# Patient Record
Sex: Male | Born: 1990 | Race: Black or African American | Hispanic: No | Marital: Single | Smoking: Former smoker
Health system: Southern US, Community
[De-identification: ages and names within clinical notes are randomized; demographics above are authoritative.]

## PROBLEM LIST (undated history)

## (undated) HISTORY — PX: WISDOM TOOTH EXTRACTION: SHX21

---

## 2010-12-10 ENCOUNTER — Emergency Department (HOSPITAL_COMMUNITY)
Admission: EM | Admit: 2010-12-10 | Discharge: 2010-12-10 | Disposition: A | Discharge status: Home / Self Care | Payer: Self-pay | Attending: Emergency Medicine | Admitting: Emergency Medicine

## 2010-12-10 DIAGNOSIS — F410 Panic disorder [episodic paroxysmal anxiety] without agoraphobia: Secondary | ICD-10-CM | POA: Insufficient documentation

## 2011-02-06 ENCOUNTER — Emergency Department (HOSPITAL_BASED_OUTPATIENT_CLINIC_OR_DEPARTMENT_OTHER)
Admission: EM | Admit: 2011-02-06 | Discharge: 2011-02-06 | Disposition: A | Discharge status: Home / Self Care | Payer: Worker's Compensation | Attending: Emergency Medicine | Admitting: Emergency Medicine

## 2011-02-06 DIAGNOSIS — S61209A Unspecified open wound of unspecified finger without damage to nail, initial encounter: Secondary | ICD-10-CM | POA: Insufficient documentation

## 2011-02-06 DIAGNOSIS — Y9289 Other specified places as the place of occurrence of the external cause: Secondary | ICD-10-CM | POA: Insufficient documentation

## 2011-02-06 DIAGNOSIS — X58XXXA Exposure to other specified factors, initial encounter: Secondary | ICD-10-CM | POA: Insufficient documentation

## 2011-02-16 ENCOUNTER — Emergency Department (HOSPITAL_BASED_OUTPATIENT_CLINIC_OR_DEPARTMENT_OTHER)
Admission: EM | Admit: 2011-02-16 | Discharge: 2011-02-16 | Disposition: A | Discharge status: Home / Self Care | Payer: Worker's Compensation | Attending: Emergency Medicine | Admitting: Emergency Medicine

## 2011-02-16 DIAGNOSIS — Z09 Encounter for follow-up examination after completed treatment for conditions other than malignant neoplasm: Secondary | ICD-10-CM | POA: Insufficient documentation

## 2011-04-26 ENCOUNTER — Encounter: Payer: Self-pay | Admitting: *Deleted

## 2011-04-26 ENCOUNTER — Emergency Department (HOSPITAL_BASED_OUTPATIENT_CLINIC_OR_DEPARTMENT_OTHER)
Admission: EM | Admit: 2011-04-26 | Discharge: 2011-04-26 | Disposition: A | Discharge status: Home / Self Care | Payer: PRIVATE HEALTH INSURANCE | Attending: Emergency Medicine | Admitting: Emergency Medicine

## 2011-04-26 DIAGNOSIS — S0500XA Injury of conjunctiva and corneal abrasion without foreign body, unspecified eye, initial encounter: Secondary | ICD-10-CM

## 2011-04-26 DIAGNOSIS — IMO0002 Reserved for concepts with insufficient information to code with codable children: Secondary | ICD-10-CM | POA: Insufficient documentation

## 2011-04-26 DIAGNOSIS — Y92009 Unspecified place in unspecified non-institutional (private) residence as the place of occurrence of the external cause: Secondary | ICD-10-CM | POA: Insufficient documentation

## 2011-04-26 DIAGNOSIS — S058X9A Other injuries of unspecified eye and orbit, initial encounter: Secondary | ICD-10-CM | POA: Insufficient documentation

## 2011-04-26 MED ORDER — TETRACAINE HCL 0.5 % OP SOLN
2.0000 [drp] | Freq: Once | OPHTHALMIC | Status: AC
  Administered 2011-04-26: 2 [drp] via OPHTHALMIC

## 2011-04-26 MED ORDER — TETRACAINE HCL 0.5 % OP SOLN
OPHTHALMIC | Status: AC
  Administered 2011-04-26: 2 [drp] via OPHTHALMIC
  Filled 2011-04-26: qty 2

## 2011-04-26 MED ORDER — LIDOCAINE-EPINEPHRINE-TETRACAINE (LET) SOLUTION: 3.0000 mL | Freq: Once | NASAL | Status: DC

## 2011-04-26 MED ORDER — POLYMYXIN B-TRIMETHOPRIM 10000-0.1 UNIT/ML-% OP SOLN: 1.0000 [drp] | OPHTHALMIC | Status: AC

## 2011-04-26 MED ORDER — FLUORESCEIN SODIUM 1 MG OP STRP
1.0000 | ORAL_STRIP | Freq: Once | OPHTHALMIC | Status: AC
  Administered 2011-04-26: 1 via OPHTHALMIC

## 2011-04-26 MED ORDER — FLUORESCEIN SODIUM 1 MG OP STRP
ORAL_STRIP | OPHTHALMIC | Status: AC
  Administered 2011-04-26: 1 via OPHTHALMIC
  Filled 2011-04-26: qty 1

## 2011-04-26 NOTE — ED Notes (Signed)
Patient states he was cleaning battery cables and blow the debris off and foreign body went into left eye.  Rubbed immediately afterwards, then flushed with water for 5 minutes.  C/O blurry vision in left eye.

## 2011-04-26 NOTE — ED Provider Notes (Signed)
History     CSN: [id number] Arrival date & time: 04/26/2011  8:40 AM  Chief Complaint  Patient presents with  . Eye Injury    foreign body in eye   HPI Comments: Patient was bowling and debris off of a battery and felt a foreign body on his left side. He flushed with 5 meds of water at home. Complains of pain in his left eye with blurry vision. His right eye is normal.  He denies any contacts or eyeglasses use. He did not get any liquid into his eye. He denies using any kind of acid or base.  The history is provided by the patient.    History reviewed. No pertinent past medical history.  Past Surgical History  Procedure Date  . Wisdom tooth extraction     No family history on file.  History  Substance Use Topics  . Smoking status: Passive Smoker    Types: Cigars  . Smokeless tobacco: Not on file  . Alcohol Use: Yes     rarely      Review of Systems  HENT: Negative for neck pain.   Eyes: Positive for photophobia, pain, discharge, redness and visual disturbance.  Respiratory: Negative for cough, chest tightness and shortness of breath.   Gastrointestinal: Negative for nausea and vomiting.  Genitourinary: Negative for dysuria and hematuria.  Musculoskeletal: Negative for back pain.  Skin: Negative for pallor.  Neurological: Negative for headaches.  Hematological: Negative.     Physical Exam  BP 121/80  Pulse 80  Temp(Src) 98.2 F (36.8 C) (Oral)  Resp 14  Ht 5\' 11"  (1.803 m)  Wt 110 lb (49.896 kg)  BMI 15.34 kg/m2  SpO2 100%  Physical Exam  Constitutional: He is oriented to person, place, and time. He appears well-developed and well-nourished. No distress.  HENT:  Head: Normocephalic and atraumatic.  Mouth/Throat: Oropharynx is clear and moist. No oropharyngeal exudate.  Eyes: EOM are normal. Pupils are equal, round, and reactive to light. No foreign bodies found. Left conjunctiva is injected.  Slit lamp exam:      The left eye shows corneal abrasion and  fluorescein uptake.    Neck: Normal range of motion.  Cardiovascular: Normal rate, regular rhythm and normal heart sounds.   Pulmonary/Chest: Effort normal and breath sounds normal. No respiratory distress.  Abdominal: Soft. There is no tenderness. There is no rebound and no guarding.  Musculoskeletal: Normal range of motion. He exhibits no edema and no tenderness.  Neurological: He is alert and oriented to person, place, and time. No cranial nerve deficit.  Skin: Skin is warm.    ED Course  Procedures  MDM Eye irritation with probable foreign body.  Tetanus UTD.    PH checked and 7-8.  Continue irrigation.  Eyes irrigated for additional 15 minutes and pH remains 7-8.  Anterior chamber clear on slit lamp.   Multiple abrasions seen without evidence foreign body on lid eversion.   Treat for corneal abrasion with ophtho followup later today.      Glynn Octave, MD 04/26/11 1023

## 2011-04-26 NOTE — ED Notes (Signed)
Irrigated left w/ NS 500 cc.  Tolerated well.

## 2011-06-20 ENCOUNTER — Encounter (HOSPITAL_BASED_OUTPATIENT_CLINIC_OR_DEPARTMENT_OTHER): Payer: Self-pay | Admitting: *Deleted

## 2011-06-20 ENCOUNTER — Emergency Department (HOSPITAL_BASED_OUTPATIENT_CLINIC_OR_DEPARTMENT_OTHER)
Admission: EM | Admit: 2011-06-20 | Discharge: 2011-06-20 | Disposition: A | Discharge status: Home / Self Care | Payer: PRIVATE HEALTH INSURANCE | Attending: Emergency Medicine | Admitting: Emergency Medicine

## 2011-06-20 DIAGNOSIS — M545 Low back pain, unspecified: Secondary | ICD-10-CM | POA: Insufficient documentation

## 2011-06-20 DIAGNOSIS — M549 Dorsalgia, unspecified: Secondary | ICD-10-CM

## 2011-06-20 MED ORDER — NAPROXEN 250 MG PO TABS
500.0000 mg | ORAL_TABLET | Freq: Once | ORAL | Status: AC
  Administered 2011-06-20: 500 mg via ORAL
  Filled 2011-06-20: qty 2

## 2011-06-20 MED ORDER — NAPROXEN 375 MG PO TABS: 375.0000 mg | ORAL_TABLET | Freq: Two times a day (BID) | ORAL | Status: DC

## 2011-06-20 MED ORDER — CYCLOBENZAPRINE HCL 10 MG PO TABS: 10.0000 mg | ORAL_TABLET | Freq: Two times a day (BID) | ORAL | Status: AC | PRN

## 2011-06-20 MED ORDER — CYCLOBENZAPRINE HCL 10 MG PO TABS
5.0000 mg | ORAL_TABLET | Freq: Once | ORAL | Status: DC
  Filled 2011-06-20: qty 1

## 2011-06-20 MED ORDER — HYDROCODONE-ACETAMINOPHEN 7.5-500 MG/15ML PO SOLN: 15.0000 mL | Freq: Every evening | ORAL | Status: AC | PRN

## 2011-06-20 NOTE — ED Provider Notes (Signed)
History     CSN: [id number] Arrival date & time: 06/20/2011  2:37 AM   First MD Initiated Contact with Patient 06/20/11 0255      Chief Complaint  Patient presents with  . Back Pain    (Consider location/radiation/quality/duration/timing/severity/associated sxs/prior treatment) Patient is a 20 y.o. male presenting with back pain. The history is provided by the patient.  Back Pain  This is a recurrent problem. The current episode started 12 to 24 hours ago. The problem occurs every several days. The problem has not changed since onset.The pain is associated with no known injury. The pain is present in the lumbar spine. The quality of the pain is described as aching. The pain does not radiate. The pain is moderate. The symptoms are aggravated by bending, twisting and certain positions. Pertinent negatives include no chest pain, no fever, no numbness, no weight loss, no headaches, no abdominal pain, no bowel incontinence, no bladder incontinence, no dysuria, no leg pain, no paresthesias, no paresis and no tingling. Treatments tried: Icy hot. The treatment provided mild relief. Risk factors: Negative for diabetes.   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 He works night and finish his shift post full by loss he is to be evaluated before returning to work. No falls or trauma otherwise. No weakness or numbness. Pain improved by rest.  History reviewed. No pertinent past medical history.  Past Surgical History  Procedure Date  . Wisdom tooth extraction     No family history on file.  History  Substance Use Topics  . Smoking status: Former Smoker    Types: Cigars  . Smokeless tobacco: Not on file  . Alcohol Use: Yes     rarely      Review of Systems  Constitutional: Negative for fever, chills and weight loss.  HENT: Negative for sore throat, neck pain and neck stiffness.   Eyes: Negative for pain.  Respiratory: Negative for  shortness of breath.   Cardiovascular: Negative for chest pain.  Gastrointestinal: Negative for abdominal pain and bowel incontinence.  Genitourinary: Negative for bladder incontinence, dysuria, frequency, hematuria and flank pain.  Musculoskeletal: Positive for back pain. Negative for joint swelling.  Skin: Negative for rash and wound.  Neurological: Negative for tingling, numbness, headaches and paresthesias.  All other systems reviewed and are negative.    Allergies  Review of patient's allergies indicates no known allergies.  Home Medications   Current Outpatient Rx  Name Route Sig Dispense Refill  . IBUPROFEN 100 MG PO CHEW Oral Chew 100 mg by mouth every 8 (eight) hours as needed.        BP 123/53  Pulse 73  Temp(Src) 98 F (36.7 C) (Oral)  Resp 18  SpO2 100%  Physical Exam  Constitutional: He is oriented to person, place, and time. He appears well-developed and well-nourished.  HENT:  Head: Normocephalic and atraumatic.  Eyes: Conjunctivae and EOM are normal. Pupils are equal, round, and reactive to light.  Neck: Full passive range of motion without pain. Neck supple. No thyromegaly present.       No midline tenderness throughout cervical thoracic and lumbar region. Localizes discomfort to right paralumbar without any reproducible tenderness. No skin discoloration or rash. No lesion. No lower extremity deficits with strength sensorium and DTR testing.  Cardiovascular: Normal rate, regular rhythm, S1 normal, S2 normal and intact distal pulses.   Pulmonary/Chest: Effort normal and breath sounds normal.  Abdominal: Soft. Bowel sounds are normal. There is no tenderness. There is no CVA tenderness.  Musculoskeletal: Normal range of motion.  Neurological: He is alert and oriented to person, place, and time. He has normal strength and normal reflexes. No cranial nerve deficit or sensory deficit. He displays a negative Romberg sign. GCS eye subscore is 4. GCS verbal subscore is  5. GCS motor subscore is 6.       Normal Gait  Skin: Skin is warm and dry. No rash noted. No cyanosis. Nails show no clubbing.  Psychiatric: He has a normal mood and affect. His speech is normal and behavior is normal.    ED Course  Procedures (including critical care time)  Labs Reviewed - No data to display    MDM  Lower back pain likely musculoskeletal in nature. No red flags on exam indicate a need for emergent MRI. Pain medications provided with back pain precautions. Reliable historian states understanding all discharge and followup instructions as well as strict return precautions for any worsening condition.        Sunnie Nielsen, MD 06/20/11 0330

## 2011-06-20 NOTE — ED Notes (Signed)
Patient feels like he has back strain, been going on for several weeks, worsening recently

## 2011-07-07 ENCOUNTER — Emergency Department (HOSPITAL_BASED_OUTPATIENT_CLINIC_OR_DEPARTMENT_OTHER)
Admission: EM | Admit: 2011-07-07 | Discharge: 2011-07-07 | Disposition: A | Discharge status: Home / Self Care | Payer: PRIVATE HEALTH INSURANCE | Attending: Emergency Medicine | Admitting: Emergency Medicine

## 2011-07-07 ENCOUNTER — Encounter (HOSPITAL_BASED_OUTPATIENT_CLINIC_OR_DEPARTMENT_OTHER): Payer: Self-pay | Admitting: *Deleted

## 2011-07-07 DIAGNOSIS — Z008 Encounter for other general examination: Secondary | ICD-10-CM | POA: Insufficient documentation

## 2011-07-07 DIAGNOSIS — Z0000 Encounter for general adult medical examination without abnormal findings: Secondary | ICD-10-CM

## 2011-07-07 MED ORDER — HYDROCODONE-ACETAMINOPHEN 5-325 MG PO TABS: 1.0000 | ORAL_TABLET | Freq: Three times a day (TID) | ORAL | Status: AC | PRN

## 2011-07-07 NOTE — ED Provider Notes (Signed)
History     CSN: [id number] Arrival date & time: 07/07/2011  3:29 PM   First MD Initiated Contact with Patient 07/07/11 1545      Chief Complaint  Patient presents with  . Letter for School/Work    (Consider location/radiation/quality/duration/timing/severity/associated sxs/prior treatment) HPI Patient is here for a work note stating go back to work. History reviewed. No pertinent past medical history.  Past Surgical History  Procedure Date  . Wisdom tooth extraction     History reviewed. No pertinent family history.  History  Substance Use Topics  . Smoking status: Former Smoker    Types: Cigars  . Smokeless tobacco: Not on file  . Alcohol Use: No     rarely      Review of Systems  All other systems reviewed and are negative.    Allergies  Review of patient's allergies indicates no known allergies.  Home Medications   Current Outpatient Rx  Name Route Sig Dispense Refill  . CYCLOBENZAPRINE HCL 10 MG PO TABS Oral Take 10 mg by mouth 3 (three) times daily as needed. For muscle spasms      . HYDROCODONE-ACETAMINOPHEN 7.5-500 MG/15ML PO SOLN Oral Take 15 mLs by mouth once as needed. For pain     . NAPROXEN 375 MG PO TABS Oral Take 375 mg by mouth 2 (two) times daily as needed. For pain       BP 107/63  Pulse 97  Temp(Src) 98.1 F (36.7 C) (Oral)  Resp 16  Ht 5\' 10"  (1.778 m)  Wt 115 lb (52.164 kg)  BMI 16.50 kg/m2  SpO2 100%  Physical Exam  Constitutional: He appears well-developed and well-nourished.  Cardiovascular: Normal rate and regular rhythm.   Pulmonary/Chest: Effort normal and breath sounds normal.  Musculoskeletal:       Lumbar back: Normal.    ED Course  Procedures (including critical care time)  Labs Reviewed - No data to display No results found.   No diagnosis found.    MDM  Patient is here only for a work note to return to work.  Patient is having no difficulty or pain at this time        Carlyle Dolly,  [place] 07/07/11 1633  Carlyle Dolly, [place] 07/07/11 [phone]

## 2011-07-07 NOTE — ED Provider Notes (Signed)
Medical screening examination/treatment/procedure(s) were performed by non-physician practitioner and as supervising physician I was immediately available for consultation/collaboration.  Ramello Cordial T Bob Eastwood, MD 07/07/11 2317 

## 2011-07-07 NOTE — ED Notes (Signed)
Pt seen here 10/29. Needs return to work/military note

## 2012-04-20 ENCOUNTER — Emergency Department (HOSPITAL_BASED_OUTPATIENT_CLINIC_OR_DEPARTMENT_OTHER)
Admission: EM | Admit: 2012-04-20 | Discharge: 2012-04-20 | Disposition: A | Discharge status: Home / Self Care | Payer: PRIVATE HEALTH INSURANCE | Attending: Emergency Medicine | Admitting: Emergency Medicine

## 2012-04-20 ENCOUNTER — Encounter (HOSPITAL_BASED_OUTPATIENT_CLINIC_OR_DEPARTMENT_OTHER): Payer: Self-pay | Admitting: Emergency Medicine

## 2012-04-20 DIAGNOSIS — M549 Dorsalgia, unspecified: Secondary | ICD-10-CM | POA: Insufficient documentation

## 2012-04-20 DIAGNOSIS — Z87891 Personal history of nicotine dependence: Secondary | ICD-10-CM | POA: Insufficient documentation

## 2012-04-20 MED ORDER — HYDROCODONE-ACETAMINOPHEN 7.5-325 MG/15ML PO SOLN: 10.0000 mL | Freq: Four times a day (QID) | ORAL | Status: AC | PRN

## 2012-04-20 MED ORDER — NAPROXEN SODIUM 220 MG PO TABS: ORAL_TABLET | ORAL | Status: DC

## 2012-04-20 MED ORDER — HYDROCODONE-ACETAMINOPHEN 5-325 MG PO TABS: 1.0000 | ORAL_TABLET | Freq: Four times a day (QID) | ORAL | Status: DC | PRN

## 2012-04-20 NOTE — ED Provider Notes (Signed)
History     CSN: [id number]  Arrival date & time 04/20/12  0032   First MD Initiated Contact with Patient 04/20/12 0126      Chief Complaint  Patient presents with  . Back Pain    (Consider location/radiation/quality/duration/timing/severity/associated sxs/prior treatment) HPI This is a 21 year old black male with about a one-year history of back pain. This pain had been mild until about a month ago when it worsened. Is located on the right side above the SI joint. The pain is sharp and worse with bending, palpation and certain positions. It is improved by sleeping with a pillow under his back. He is here this morning because it acutely worsened several hours ago. There is no associated numbness or weakness and there are no bowel or bladder changes. He had previously been getting relief from ibuprofen but not now.  History reviewed. No pertinent past medical history.  Past Surgical History  Procedure Date  . Wisdom tooth extraction     No family history on file.  History  Substance Use Topics  . Smoking status: Former Smoker    Types: Cigars  . Smokeless tobacco: Not on file  . Alcohol Use: Not on file     rarely      Review of Systems  All other systems reviewed and are negative.    Allergies  Review of patient's allergies indicates no known allergies.  Home Medications   Current Outpatient Rx  Name Route Sig Dispense Refill  . CYCLOBENZAPRINE HCL 10 MG PO TABS Oral Take 10 mg by mouth 3 (three) times daily as needed. For muscle spasms      . HYDROCODONE-ACETAMINOPHEN 7.5-500 MG/15ML PO SOLN Oral Take 15 mLs by mouth once as needed. For pain     . NAPROXEN 375 MG PO TABS Oral Take 375 mg by mouth 2 (two) times daily as needed. For pain       BP 120/75  Pulse 74  Temp 98.6 F (37 C) (Oral)  Resp 20  Ht 5\' 11"  (1.803 m)  Wt 112 lb 12.8 oz (51.166 kg)  BMI 15.73 kg/m2  SpO2 100%  Physical Exam General: Well-developed, well-nourished male in no acute  distress; appearance consistent with age of record HENT: normocephalic, atraumatic Eyes: Cosmetic contact lenses Neck: supple Heart: regular rate and rhythm Lungs: clear to auscultation bilaterally Abdomen: soft; nondistended; nontender Back: Tenderness above right SI joint without palpable muscle spasm; pain on flexion of back Extremities: No deformity; full range of motion Neurologic: Awake, alert and oriented; motor function intact in all extremities and symmetric; no facial droop Skin: Warm and dry Psychiatric: Normal mood and affect    ED Course  Procedures (including critical care time)     MDM  Patient has been prescribed Flexeril in the past but he states that it causes him to be drowsy and dysphoric.        Hanley Seamen, MD 04/20/12 [phone]

## 2012-04-20 NOTE — ED Notes (Signed)
Denies numbness or tingling down either leg.  Denies any loss of bladder or bowel.  Denies any injuries.

## 2012-04-20 NOTE — ED Notes (Signed)
DR Molpus at bedside 

## 2012-04-20 NOTE — ED Notes (Signed)
Back and rt side pain x 1 month

## 2015-05-10 ENCOUNTER — Emergency Department (HOSPITAL_BASED_OUTPATIENT_CLINIC_OR_DEPARTMENT_OTHER)
Admission: EM | Admit: 2015-05-10 | Discharge: 2015-05-10 | Disposition: A | Discharge status: Home / Self Care | Payer: Managed Care, Other (non HMO) | Attending: Emergency Medicine | Admitting: Emergency Medicine

## 2015-05-10 ENCOUNTER — Encounter (HOSPITAL_BASED_OUTPATIENT_CLINIC_OR_DEPARTMENT_OTHER): Payer: Self-pay | Admitting: *Deleted

## 2015-05-10 DIAGNOSIS — Z87891 Personal history of nicotine dependence: Secondary | ICD-10-CM | POA: Diagnosis not present

## 2015-05-10 DIAGNOSIS — N342 Other urethritis: Secondary | ICD-10-CM | POA: Diagnosis not present

## 2015-05-10 DIAGNOSIS — Z791 Long term (current) use of non-steroidal anti-inflammatories (NSAID): Secondary | ICD-10-CM | POA: Insufficient documentation

## 2015-05-10 DIAGNOSIS — R300 Dysuria: Secondary | ICD-10-CM | POA: Diagnosis present

## 2015-05-10 LAB — WET PREP, GENITAL
Clue Cells Wet Prep HPF POC: NONE SEEN
TRICH WET PREP: NONE SEEN
Yeast Wet Prep HPF POC: NONE SEEN

## 2015-05-10 MED ORDER — CEFTRIAXONE SODIUM 250 MG IJ SOLR
250.0000 mg | INTRAMUSCULAR | Status: DC
  Administered 2015-05-10: 250 mg via INTRAMUSCULAR
  Filled 2015-05-10: qty 250

## 2015-05-10 MED ORDER — LIDOCAINE HCL (PF) 1 % IJ SOLN
INTRAMUSCULAR | Status: AC
Start: 2015-05-10 — End: 2015-05-10
  Administered 2015-05-10: 08:00:00
  Filled 2015-05-10: qty 5

## 2015-05-10 MED ORDER — AZITHROMYCIN 250 MG PO TABS
1000.0000 mg | ORAL_TABLET | Freq: Once | ORAL | Status: AC
  Administered 2015-05-10: 1000 mg via ORAL
  Filled 2015-05-10: qty 4

## 2015-05-10 NOTE — ED Provider Notes (Addendum)
CSN: [id number]     Arrival date & time 05/10/15  4098 History   First MD Initiated Contact with Patient 05/10/15 [phone]     Chief Complaint  Patient presents with  . Dysuria      HPI  Patient presents for evaluation of urethral discharge and dysuria. He states he started noticing a yellow no acute drainage from his penis 2 days ago. He was told by X partner a month ago that she had "cheated on me and had gonorrhea". He notices some increased frequency of urination. No source blisters lesions. No testicular pain or groin swelling.  History reviewed. No pertinent past medical history. Past Surgical History  Procedure Laterality Date  . Wisdom tooth extraction     History reviewed. No pertinent family history. Social History  Substance Use Topics  . Smoking status: Former Smoker    Types: Cigars  . Smokeless tobacco: None  . Alcohol Use: None     Comment: rarely    Review of Systems  Constitutional: Negative for fever, chills, diaphoresis, appetite change and fatigue.  HENT: Negative for mouth sores, sore throat and trouble swallowing.   Eyes: Negative for visual disturbance.  Respiratory: Negative for cough, chest tightness, shortness of breath and wheezing.   Cardiovascular: Negative for chest pain.  Gastrointestinal: Negative for nausea, vomiting, abdominal pain, diarrhea and abdominal distention.  Endocrine: Negative for polydipsia, polyphagia and polyuria.  Genitourinary: Positive for dysuria and discharge. Negative for frequency and hematuria.  Musculoskeletal: Negative for gait problem.  Skin: Negative for color change, pallor and rash.  Neurological: Negative for dizziness, syncope, light-headedness and headaches.  Hematological: Does not bruise/bleed easily.  Psychiatric/Behavioral: Negative for behavioral problems and confusion.      Allergies  Review of patient's allergies indicates no known allergies.  Home Medications   Prior to Admission medications    Medication Sig Start Date End Date Taking? Authorizing Provider  cyclobenzaprine (FLEXERIL) 10 MG tablet Take 10 mg by mouth 3 (three) times daily as needed. For muscle spasms      Historical Provider, MD  HYDROcodone-acetaminophen (LORTAB) 7.5-500 MG/15ML solution Take 15 mLs by mouth once as needed. For pain     Historical Provider, MD  naproxen sodium (ALEVE) 220 MG tablet Take 2 tablets twice daily for back pain. Best taken with a meal. 04/20/12   John Molpus, MD   BP 103/85 mmHg  Pulse 80  Temp(Src) 97.9 F (36.6 C) (Oral)  Resp 16  Ht  (1.803 m)  Wt 120 lb (54.432 kg)  BMI 16.74 kg/m2  SpO2 100% Physical Exam  Constitutional: He is oriented to person, place, and time. He appears well-developed and well-nourished. No distress.  HENT:  Head: Normocephalic.  Eyes: Conjunctivae are normal. Pupils are equal, round, and reactive to light. No scleral icterus.  Neck: Normal range of motion. Neck supple. No thyromegaly present.  Cardiovascular: Normal rate and regular rhythm.  Exam reveals no gallop and no friction rub.   No murmur heard. Pulmonary/Chest: Effort normal and breath sounds normal. No respiratory distress. He has no wheezes. He has no rales.  Abdominal: Soft. Bowel sounds are normal. He exhibits no distension. There is no tenderness. There is no rebound.  Genitourinary:     Musculoskeletal: Normal range of motion.  Neurological: He is alert and oriented to person, place, and time.  Skin: Skin is warm and dry. No rash noted.  Psychiatric: He has a normal mood and affect. His behavior is normal.    ED  Course  Procedures (including critical care time) Labs Review Labs Reviewed  WET PREP, GENITAL - Abnormal; Notable for the following:    WBC, Wet Prep HPF POC TOO NUMEROUS TO COUNT (*)    All other components within normal limits  GC/CHLAMYDIA PROBE AMP ([place]) NOT AT [facility]    Imaging Review No results found. I have personally reviewed and evaluated  these images and lab results as part of my medical decision-making.   EKG Interpretation None      MDM   Final diagnoses:  Urethritis    Wet prep GC chlamydia collected. Given Rocephin IM, by mouth Zithromax.    Rolland Porter, MD 05/10/15 1610  Rolland Porter, MD 05/10/15 [phone]

## 2015-05-10 NOTE — ED Notes (Signed)
No reaction noted after IM injection

## 2015-05-10 NOTE — ED Notes (Signed)
Cultures to lab

## 2015-05-10 NOTE — ED Notes (Signed)
Culture swab obtained by EDP

## 2015-05-10 NOTE — Discharge Instructions (Signed)
You have been given empiric treatment for both gonorrhea, and chlamydia. No additional treatment is necessary. Your testing results would be available through medical records, or " my chart" in 48-72 hours.   Urethritis Urethritis is an inflammation of the tube through which urine exits your bladder (urethra).  CAUSES Urethritis is often caused by an infection in your urethra. The infection can be viral, like herpes. The infection can also be bacterial, like gonorrhea. RISK FACTORS Risk factors of urethritis include:  Having sex without using a condom.  Having multiple sexual partners.  Having poor hygiene. SIGNS AND SYMPTOMS Symptoms of urethritis are less noticeable in women than in men. These symptoms include:  Burning feeling when you urinate (dysuria).  Discharge from your urethra.  Blood in your urine (hematuria).  Urinating more than usual. DIAGNOSIS  To confirm a diagnosis of urethritis, your health care provider will do the following:  Ask about your sexual history.  Perform a physical exam.  Have you provide a sample of your urine for lab testing.  Use a cotton swab to gently collect a sample from your urethra for lab testing. TREATMENT  It is important to treat urethritis. Depending on the cause, untreated urethritis may lead to serious genital infections and possibly infertility. Urethritis caused by a bacterial infection is treated with antibiotic medicine. All sexual partners must be treated.  HOME CARE INSTRUCTIONS  Do not have sex until the test results are known and treatment is completed, even if your symptoms go away before you finish treatment.  If you were prescribed an antibiotic, finish it all even if you start to feel better. SEEK MEDICAL CARE IF:   Your symptoms are not improved in 3 days.  Your symptoms are getting worse.  You develop abdominal pain or pelvic pain (in women).  You develop joint pain.  You have a fever. SEEK IMMEDIATE  MEDICAL CARE IF:   You have severe pain in the belly, back, or side.  You have repeated vomiting. MAKE SURE YOU:  Understand these instructions.  Will watch your condition.  Will get help right away if you are not doing well or get worse. Document Released: 02/01/2001 Document Revised: 12/23/2013 Document Reviewed: 04/08/2013 [facility] Patient Information 2015 [place], [place]. This information is not intended to replace advice given to you by your health care provider. Make sure you discuss any questions you have with your health care provider.

## 2015-05-10 NOTE — ED Notes (Signed)
MD at bedside. 

## 2015-05-10 NOTE — ED Notes (Signed)
States has "milky white" drainage from penis, states that male partner has been exposed and has an STD, mild burning

## 2015-05-10 NOTE — ED Notes (Signed)
Pt states has discharge from penis intermittently. Mild burning at time upon urination

## 2015-05-11 LAB — GC/CHLAMYDIA PROBE AMP (~~LOC~~) NOT AT ARMC
Chlamydia: NEGATIVE
NEISSERIA GONORRHEA: POSITIVE — AB

## 2015-05-12 ENCOUNTER — Telehealth (HOSPITAL_COMMUNITY): Payer: Self-pay

## 2015-05-12 NOTE — Telephone Encounter (Signed)
Positive for gonorrhea. Treated per protocol. [employer] form faxed. Attempting to contact.  

## 2015-05-13 ENCOUNTER — Telehealth (HOSPITAL_COMMUNITY): Payer: Self-pay | Admitting: Emergency Medicine

## 2015-05-13 NOTE — Telephone Encounter (Signed)
ID verified x three, patient notified of positive Gonorrhea and that treatment was given while in ED. STD instructions given, patient verbalized understanding.

## 2016-12-25 ENCOUNTER — Emergency Department (HOSPITAL_BASED_OUTPATIENT_CLINIC_OR_DEPARTMENT_OTHER)
Admission: EM | Admit: 2016-12-25 | Discharge: 2016-12-25 | Disposition: A | Discharge status: Home / Self Care | Payer: PRIVATE HEALTH INSURANCE | Attending: Physician Assistant | Admitting: Physician Assistant

## 2016-12-25 ENCOUNTER — Encounter (HOSPITAL_BASED_OUTPATIENT_CLINIC_OR_DEPARTMENT_OTHER): Payer: Self-pay | Admitting: *Deleted

## 2016-12-25 DIAGNOSIS — Y9389 Activity, other specified: Secondary | ICD-10-CM | POA: Insufficient documentation

## 2016-12-25 DIAGNOSIS — Y929 Unspecified place or not applicable: Secondary | ICD-10-CM | POA: Insufficient documentation

## 2016-12-25 DIAGNOSIS — X58XXXA Exposure to other specified factors, initial encounter: Secondary | ICD-10-CM | POA: Insufficient documentation

## 2016-12-25 DIAGNOSIS — S39012A Strain of muscle, fascia and tendon of lower back, initial encounter: Secondary | ICD-10-CM | POA: Insufficient documentation

## 2016-12-25 DIAGNOSIS — Z87891 Personal history of nicotine dependence: Secondary | ICD-10-CM | POA: Insufficient documentation

## 2016-12-25 DIAGNOSIS — Y999 Unspecified external cause status: Secondary | ICD-10-CM | POA: Insufficient documentation

## 2016-12-25 MED ORDER — CYCLOBENZAPRINE HCL 10 MG PO TABS: 10.0000 mg | ORAL_TABLET | Freq: Two times a day (BID) | ORAL | 0 refills | Status: DC | PRN

## 2016-12-25 MED ORDER — IBUPROFEN 100 MG/5ML PO SUSP: 10.0000 mg/kg | Freq: Four times a day (QID) | ORAL | 0 refills | Status: DC | PRN

## 2016-12-25 MED ORDER — IBUPROFEN 100 MG/5ML PO SUSP
600.0000 mg | Freq: Once | ORAL | Status: AC
  Administered 2016-12-25: 600 mg via ORAL
  Filled 2016-12-25: qty 30

## 2016-12-25 NOTE — ED Provider Notes (Addendum)
[facility] Provider Note   CSN: [id number] Arrival date & time: 12/25/16  0202     History   Chief Complaint Chief Complaint  Patient presents with  . Back Pain    HPI Eddie Savage is a 26 y.o. male.  HPI  26 year old male presented back pain. Patient has had back pain for a number of years. He reports that when he works out feels better. He reports it has not been working recently. 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 Patient has no red flag symptoms no numbness tingling weakness or IV drug use or fever. No recent trauma.  Patient reports he's been taking 100 mg of ibuprofen 4 times daily.  History reviewed. No pertinent past medical history.  There are no active problems to display for this patient.   Past Surgical History:  Procedure Laterality Date  . WISDOM TOOTH EXTRACTION         Home Medications    Prior to Admission medications   Medication Sig Start Date End Date Taking? Authorizing Provider  cyclobenzaprine (FLEXERIL) 10 MG tablet Take 10 mg by mouth 3 (three) times daily as needed. For muscle spasms      [provider]  HYDROcodone-acetaminophen (LORTAB) 7.5-500 MG/15ML solution Take 15 mLs by mouth once as needed. For pain     [provider]  naproxen sodium (ALEVE) 220 MG tablet Take 2 tablets twice daily for back pain. Best taken with a meal. 04/20/12   Molpus, Jonny Ruiz, MD    Family History History reviewed. No pertinent family history.  Social History Social History  Substance Use Topics  . Smoking status: Former Smoker    Types: Cigars  . Smokeless tobacco: Never Used  . Alcohol use Not on file     Comment: rarely     Allergies   Patient has no known allergies.   Review of Systems Review of Systems  Constitutional: Negative for activity change.  Respiratory: Negative for shortness of breath.   Cardiovascular: Negative for chest pain.  Gastrointestinal:  Negative for abdominal pain.  Musculoskeletal: Positive for back pain.     Physical Exam Updated Vital Signs BP 137/81 (BP Location: Left Arm)   Pulse 86   Temp 98.2 F (36.8 C) (Oral)   Resp 18   Ht 5\' 9"  (1.753 m)   Wt 130 lb (59 kg)   SpO2 100%   BMI 19.20 kg/m   Physical Exam  Constitutional: He appears well-developed and well-nourished.  HENT:  Head: Normocephalic and atraumatic.  Eyes: Conjunctivae are normal.  Neck: Neck supple.  Cardiovascular: Normal rate and regular rhythm.   No murmur heard. Pulmonary/Chest: Effort normal and breath sounds normal. No respiratory distress.  Abdominal: Soft. There is no tenderness.  Musculoskeletal: He exhibits no edema.  Tenderness bilateraly paraspinal muscles in L spine.  Neurological: He is alert.  Skin: Skin is warm and dry.  Psychiatric: He has a normal mood and affect.  Nursing note and vitals reviewed.    ED Treatments / Results  Labs (all labs ordered are listed, but only abnormal results are displayed) Labs Reviewed - No data to display  EKG  EKG Interpretation None       Radiology No results found.  Procedures Procedures (including critical care time)  Medications Ordered in ED Medications - No data to display   Initial Impression / Assessment and Plan / ED Course  I have reviewed the triage  vital signs and the nursing notes.  Pertinent labs & imaging results that were available during my care of the patient were reviewed by me and considered in my medical decision making (see chart for details).     Very well-appearing 26 year old male presenting with back pain for last 5 days. Patient reports not being able sleep very well due to the pain. Patient was given narcotic pain medication the past. However with the [place] opiate regulations I don't believe that narcotic pain medication is warranted. We will give him liquid ibuprofen and Flexeril to help with the symptoms. Suspec msk pain given symtpoms.  Follow up with primary care physician for further care. No red flags.   Final Clinical Impressions(s) / ED Diagnoses   Final diagnoses:  None    New Prescriptions New Prescriptions   No medications on file     Abelino DerrickMackuen, Shaquila Sigman Lyn, MD 12/25/16 0235    Abelino DerrickMackuen, Wilhelmina Hark Lyn, MD 12/25/16 [phone]

## 2016-12-25 NOTE — ED Notes (Signed)
Pt given d/c instructions as per chart. Rx x 2. Verbalizes understanding. No questions. 

## 2016-12-25 NOTE — ED Notes (Signed)
Pt was triaged by this RN.

## 2016-12-25 NOTE — ED Triage Notes (Signed)
Pt states he has had back pain x 2-3 years. Has seen chiropractor x 1 yr. Recent hx of lower back pain (similar to previous) x 5-6 days. Unable to sleep. Denies other s/s.

## 2016-12-25 NOTE — Discharge Instructions (Signed)
We think this is likely a back strain. Please use the medications as provided and follow-up with her primary care physician.  To find a primary care or specialty doctor please call [phone] or [phone] to access "[facility] Find a Doctor Service."  You may also go on the [facility] website at [email or web address]  There are also multiple [facility] and [facility] practices throughout the [place] that are frequently accepting new patients. You may find a clinic that is close to your home and contact them.  [facility] -  [address] [place] [id number] [phone]  [facility] in [place] (also locations in [place] and [place]) -  [address] [place] [address] [phone]  [facility] -  [address] [place] [address] [phone]

## 2018-03-10 ENCOUNTER — Emergency Department (HOSPITAL_BASED_OUTPATIENT_CLINIC_OR_DEPARTMENT_OTHER)
Admission: EM | Admit: 2018-03-10 | Discharge: 2018-03-10 | Disposition: A | Discharge status: Home / Self Care | Payer: 59 | Attending: Emergency Medicine | Admitting: Emergency Medicine

## 2018-03-10 ENCOUNTER — Other Ambulatory Visit: Payer: Self-pay

## 2018-03-10 ENCOUNTER — Encounter (HOSPITAL_BASED_OUTPATIENT_CLINIC_OR_DEPARTMENT_OTHER): Payer: Self-pay | Admitting: *Deleted

## 2018-03-10 ENCOUNTER — Emergency Department (HOSPITAL_BASED_OUTPATIENT_CLINIC_OR_DEPARTMENT_OTHER): Payer: 59

## 2018-03-10 DIAGNOSIS — Y999 Unspecified external cause status: Secondary | ICD-10-CM | POA: Insufficient documentation

## 2018-03-10 DIAGNOSIS — S9001XA Contusion of right ankle, initial encounter: Secondary | ICD-10-CM | POA: Insufficient documentation

## 2018-03-10 DIAGNOSIS — Z87891 Personal history of nicotine dependence: Secondary | ICD-10-CM | POA: Diagnosis not present

## 2018-03-10 DIAGNOSIS — Y939 Activity, unspecified: Secondary | ICD-10-CM | POA: Diagnosis not present

## 2018-03-10 DIAGNOSIS — Z79899 Other long term (current) drug therapy: Secondary | ICD-10-CM | POA: Diagnosis not present

## 2018-03-10 DIAGNOSIS — S99911A Unspecified injury of right ankle, initial encounter: Secondary | ICD-10-CM | POA: Diagnosis present

## 2018-03-10 DIAGNOSIS — W228XXA Striking against or struck by other objects, initial encounter: Secondary | ICD-10-CM | POA: Diagnosis not present

## 2018-03-10 DIAGNOSIS — Y929 Unspecified place or not applicable: Secondary | ICD-10-CM | POA: Insufficient documentation

## 2018-03-10 NOTE — Discharge Instructions (Signed)
Please read and follow all provided instructions.  Your diagnoses today include:  1. Contusion of right ankle, initial encounter     Tests performed today include:  An x-ray of your ankle - does NOT show any broken bones  Vital signs. See below for your results today.   Medications prescribed:   None  Take any prescribed medications only as directed.  Home care instructions:   Follow any educational materials contained in this packet  Follow R.I.C.E. Protocol:  R - rest your injury   I  - use ice on injury without applying directly to skin  C - compress injury with bandage or splint  E - elevate the injury as much as possible  Follow-up instructions: Please follow-up with your primary care provider if you continue to have significant pain or trouble walking in 1 week.   Return instructions:   Please return if your toes are numb or tingling, appear gray or blue, or you have severe pain (also elevate leg and loosen splint or wrap)  Please return to the Emergency Department if you experience worsening symptoms.   Please return if you have any other emergent concerns.  Additional Information:  Your vital signs today were: BP 132/80 (BP Location: Left Arm)    Pulse 78    Temp 98.4 F (36.9 C) (Oral)    Resp 16    Ht 6' (1.829 m)    Wt 61.7 kg (136 lb)    SpO2 99%    BMI 18.44 kg/m  If your blood pressure (BP) was elevated above 135/85 this visit, please have this repeated by your doctor within one month. --------------

## 2018-03-10 NOTE — ED Triage Notes (Signed)
Pt reports right ankle injury at work yesterday. A pallet slid and hit his ankle. Pt ambulatory to triage

## 2018-03-10 NOTE — ED Provider Notes (Signed)
[facility] EMERGENCY DEPARTMENT Provider Note   CSN: [id number] Arrival date & time: 03/10/18  2037     History   Chief Complaint Chief Complaint  Patient presents with  . Ankle Pain    HPI Eddie Savage is a 27 y.o. male.  Patient presents with acute onset of right lateral ankle pain started yesterday when a pallet slid and struck his ankle.  Patient had pain at time of the incident but worse pain today prompting emergency department visit.  He has been ambulatory.  No treatments prior to arrival.  No knee or hip pain.  No other injuries reported.  Onset of symptoms acute.  Course is constant.     History reviewed. No pertinent past medical history.  There are no active problems to display for this patient.   Past Surgical History:  Procedure Laterality Date  . WISDOM TOOTH EXTRACTION          Home Medications    Prior to Admission medications   Medication Sig Start Date End Date Taking? Authorizing Provider  cyclobenzaprine (FLEXERIL) 10 MG tablet Take 1 tablet (10 mg total) by mouth 2 (two) times daily as needed for muscle spasms. 12/25/16   Mackuen, Courteney Lyn, MD  ibuprofen (CHILDRENS IBUPROFEN 100) 100 MG/5ML suspension Take 29.5 mLs (590 mg total) by mouth every 6 (six) hours as needed. 12/25/16   Mackuen, Courteney Lyn, MD  naproxen sodium (ALEVE) 220 MG tablet Take 2 tablets twice daily for back pain. Best taken with a meal. 04/20/12   Molpus, John, MD    Family History No family history on file.  Social History Social History   Tobacco Use  . Smoking status: Former Smoker    Types: Cigars  . Smokeless tobacco: Never Used  Substance Use Topics  . Alcohol use: Yes    Comment: rarely  . Drug use: No     Allergies   Patient has no known allergies.   Review of Systems Review of Systems  Constitutional: Negative for activity change.  Musculoskeletal: Positive for arthralgias and joint swelling. Negative for back pain, gait problem and  neck pain.  Skin: Negative for wound.  Neurological: Negative for weakness and numbness.     Physical Exam Updated Vital Signs BP 132/80 (BP Location: Left Arm)   Pulse 78   Temp 98.4 F (36.9 C) (Oral)   Resp 16   Ht 6' (1.829 m)   Wt 61.7 kg (136 lb)   SpO2 99%   BMI 18.44 kg/m   Physical Exam  Constitutional: He appears well-developed and well-nourished.  HENT:  Head: Normocephalic and atraumatic.  Eyes: Conjunctivae are normal.  Neck: Normal range of motion. Neck supple.  Cardiovascular:  Pulses:      Dorsalis pedis pulses are 2+ on the right side, and 2+ on the left side.       Posterior tibial pulses are 2+ on the right side, and 2+ on the left side.  Pulmonary/Chest: No respiratory distress.  Musculoskeletal: He exhibits tenderness. He exhibits no edema.       Right hip: Normal.       Right knee: Normal.       Right ankle: He exhibits normal range of motion, no swelling and no ecchymosis. Tenderness. Lateral malleolus tenderness found.  Neurological: He is alert.  Distal motor, sensation, and vascular intact.  Skin: Skin is warm and dry.  Psychiatric: He has a normal mood and affect.  Vitals reviewed.    ED Treatments /  Results  Labs (all labs ordered are listed, but only abnormal results are displayed) Labs Reviewed - No data to display  EKG None  Radiology Dg Ankle Complete Right  Result Date: 03/10/2018 CLINICAL DATA:  Hit by wooden pallet EXAM: RIGHT ANKLE - COMPLETE 3+ VIEW COMPARISON:  None. FINDINGS: Frontal, oblique, and lateral views obtained. No fracture or joint effusion. No joint space narrowing or erosion. No radiopaque foreign body. Ankle mortise appears intact. IMPRESSION: No evident fracture or appreciable arthropathy. No radiopaque foreign body. Ankle mortise appears intact. Electronically Signed   By: Bretta BangWilliam  Woodruff III M.D.   On: 03/10/2018 21:40    Procedures Procedures (including critical care time)  Medications Ordered in  ED Medications - No data to display   Initial Impression / Assessment and Plan / ED Course  I have reviewed the triage vital signs and the nursing notes.  Pertinent labs & imaging results that were available during my care of the patient were reviewed by me and considered in my medical decision making (see chart for details).     Patient seen and examined.   Vital signs reviewed and are as follows: BP 136/69 (BP Location: Right Arm)   Pulse 91   Temp 98.4 F (36.9 C) (Oral)   Resp 16   Ht 6' (1.829 m)   Wt 61.7 kg (136 lb)   SpO2 97%   BMI 18.44 kg/m   Imaging reviewed with patient at bedside.  I do not see any obvious fractures to the tender area.  Counseled on rice protocol, follow-up with PCP in 1 week if not improving.   Final Clinical Impressions(s) / ED Diagnoses   Final diagnoses:  Contusion of right ankle, initial encounter   Patient with ankle contusion.  X-rays negative.  Patient ambulatory.  Foot is neurovascularly intact.  ED Discharge Orders    None       Renne CriglerGeiple, Linnea Todisco, Cordelia Poche-C 03/10/18 2209    Tegeler, Canary Brimhristopher J, MD 03/11/18 [phone]

## 2018-03-10 NOTE — ED Notes (Signed)
Patient transported to X-ray 

## 2020-12-05 ENCOUNTER — Ambulatory Visit (HOSPITAL_BASED_OUTPATIENT_CLINIC_OR_DEPARTMENT_OTHER)
Admission: EM | Admit: 2020-12-05 | Discharge: 2020-12-06 | Payer: 59 | Attending: Emergency Medicine | Admitting: Emergency Medicine

## 2020-12-05 ENCOUNTER — Encounter (HOSPITAL_COMMUNITY): Admission: EM | Payer: Self-pay | Source: Home / Self Care | Attending: Emergency Medicine

## 2020-12-05 ENCOUNTER — Encounter (HOSPITAL_COMMUNITY): Payer: Self-pay

## 2020-12-05 ENCOUNTER — Emergency Department (HOSPITAL_COMMUNITY): Payer: 59

## 2020-12-05 ENCOUNTER — Emergency Department (HOSPITAL_BASED_OUTPATIENT_CLINIC_OR_DEPARTMENT_OTHER): Payer: 59

## 2020-12-05 ENCOUNTER — Other Ambulatory Visit: Payer: Self-pay

## 2020-12-05 ENCOUNTER — Emergency Department (HOSPITAL_COMMUNITY): Payer: 59 | Admitting: Anesthesiology

## 2020-12-05 DIAGNOSIS — Y990 Civilian activity done for income or pay: Secondary | ICD-10-CM | POA: Diagnosis not present

## 2020-12-05 DIAGNOSIS — S63691A Other sprain of left index finger, initial encounter: Secondary | ICD-10-CM | POA: Diagnosis not present

## 2020-12-05 DIAGNOSIS — S66321A Laceration of extensor muscle, fascia and tendon of left index finger at wrist and hand level, initial encounter: Secondary | ICD-10-CM | POA: Diagnosis not present

## 2020-12-05 DIAGNOSIS — S62311B Displaced fracture of base of second metacarpal bone. left hand, initial encounter for open fracture: Secondary | ICD-10-CM | POA: Diagnosis not present

## 2020-12-05 DIAGNOSIS — S62641B Nondisplaced fracture of proximal phalanx of left index finger, initial encounter for open fracture: Secondary | ICD-10-CM

## 2020-12-05 DIAGNOSIS — Z20822 Contact with and (suspected) exposure to covid-19: Secondary | ICD-10-CM | POA: Diagnosis not present

## 2020-12-05 DIAGNOSIS — S6992XA Unspecified injury of left wrist, hand and finger(s), initial encounter: Secondary | ICD-10-CM | POA: Diagnosis present

## 2020-12-05 DIAGNOSIS — Z87891 Personal history of nicotine dependence: Secondary | ICD-10-CM | POA: Insufficient documentation

## 2020-12-05 DIAGNOSIS — W270XXA Contact with workbench tool, initial encounter: Secondary | ICD-10-CM | POA: Insufficient documentation

## 2020-12-05 HISTORY — PX: OPEN REDUCTION INTERNAL FIXATION (ORIF) METACARPAL: SHX6234

## 2020-12-05 HISTORY — PX: I & D EXTREMITY: SHX5045

## 2020-12-05 HISTORY — PX: REPAIR EXTENSOR TENDON: SHX5382

## 2020-12-05 LAB — RESP PANEL BY RT-PCR (FLU A&B, COVID) ARPGX2
Influenza A by PCR: NEGATIVE
Influenza B by PCR: NEGATIVE
SARS Coronavirus 2 by RT PCR: NEGATIVE

## 2020-12-05 SURGERY — REPAIR, TENDON, EXTENSOR
Anesthesia: General | Site: Hand | Laterality: Left

## 2020-12-05 MED ORDER — ONDANSETRON HCL 4 MG/2ML IJ SOLN
INTRAMUSCULAR | Status: DC | PRN
  Administered 2020-12-05: 4 mg via INTRAVENOUS

## 2020-12-05 MED ORDER — LIDOCAINE HCL (CARDIAC) PF 100 MG/5ML IV SOSY
PREFILLED_SYRINGE | INTRAVENOUS | Status: DC | PRN
  Administered 2020-12-05: 40 mg via INTRATRACHEAL

## 2020-12-05 MED ORDER — SUCCINYLCHOLINE CHLORIDE 20 MG/ML IJ SOLN
INTRAMUSCULAR | Status: DC | PRN
  Administered 2020-12-05: 100 mg via INTRAVENOUS

## 2020-12-05 MED ORDER — CEFAZOLIN SODIUM 1 G IJ SOLR
1.0000 g | Freq: Once | INTRAMUSCULAR | Status: AC
  Administered 2020-12-05: 1 g via INTRAMUSCULAR
  Filled 2020-12-05: qty 10

## 2020-12-05 MED ORDER — STERILE WATER FOR INJECTION IJ SOLN
5.0000 mL | Freq: Once | INTRAMUSCULAR | Status: AC
  Administered 2020-12-05: 5 mL via INTRAMUSCULAR

## 2020-12-05 MED ORDER — PROPOFOL 10 MG/ML IV BOLUS
INTRAVENOUS | Status: DC | PRN
  Administered 2020-12-05: 130 mg via INTRAVENOUS

## 2020-12-05 MED ORDER — MIDAZOLAM HCL 5 MG/5ML IJ SOLN
INTRAMUSCULAR | Status: DC | PRN
  Administered 2020-12-05: 2 mg via INTRAVENOUS

## 2020-12-05 MED ORDER — BUPIVACAINE HCL (PF) 0.25 % IJ SOLN
INTRAMUSCULAR | Status: AC
  Filled 2020-12-05: qty 30

## 2020-12-05 MED ORDER — TETANUS-DIPHTH-ACELL PERTUSSIS 5-2.5-18.5 LF-MCG/0.5 IM SUSY
0.5000 mL | PREFILLED_SYRINGE | Freq: Once | INTRAMUSCULAR | Status: DC
  Filled 2020-12-05: qty 0.5

## 2020-12-05 MED ORDER — 0.9 % SODIUM CHLORIDE (POUR BTL) OPTIME
TOPICAL | Status: DC | PRN
  Administered 2020-12-05: 1000 mL
  Administered 2020-12-05: 3000 mL

## 2020-12-05 MED ORDER — FENTANYL CITRATE (PF) 250 MCG/5ML IJ SOLN
INTRAMUSCULAR | Status: DC | PRN
  Administered 2020-12-05 (×2): 100 ug via INTRAVENOUS
  Administered 2020-12-05: 50 ug via INTRAVENOUS

## 2020-12-05 MED ORDER — DEXAMETHASONE SODIUM PHOSPHATE 10 MG/ML IJ SOLN
INTRAMUSCULAR | Status: DC | PRN
  Administered 2020-12-05: 10 mg via INTRAVENOUS

## 2020-12-05 MED ORDER — CEFAZOLIN SODIUM 500 MG IJ SOLR: 2.0000 mg | Freq: Once | INTRAMUSCULAR | Status: DC

## 2020-12-05 MED ORDER — LACTATED RINGERS IV SOLN: INTRAVENOUS | Status: DC | PRN

## 2020-12-05 MED ORDER — BUPIVACAINE HCL (PF) 0.25 % IJ SOLN
INTRAMUSCULAR | Status: DC | PRN
  Administered 2020-12-05: 10 mL

## 2020-12-05 MED ORDER — CEFAZOLIN SODIUM-DEXTROSE 2-4 GM/100ML-% IV SOLN
2.0000 g | Freq: Once | INTRAVENOUS | Status: DC
  Filled 2020-12-05: qty 100

## 2020-12-05 SURGICAL SUPPLY — 68 items
ADAPTER CATH SYR TO TUBING 38M (ADAPTER) ×2 IMPLANT
BNDG COHESIVE 2X5 TAN STRL LF (GAUZE/BANDAGES/DRESSINGS) IMPLANT
BNDG ELASTIC 3X5.8 VLCR STR LF (GAUZE/BANDAGES/DRESSINGS) ×2 IMPLANT
BNDG ELASTIC 4X5.8 VLCR STR LF (GAUZE/BANDAGES/DRESSINGS) ×2 IMPLANT
BNDG ESMARK 4X9 LF (GAUZE/BANDAGES/DRESSINGS) IMPLANT
BNDG GAUZE ELAST 4 BULKY (GAUZE/BANDAGES/DRESSINGS) ×2 IMPLANT
CANNULA VESSEL 3MM 2 BLNT TIP (CANNULA) IMPLANT
CORD BIPOLAR FORCEPS 12FT (ELECTRODE) ×2 IMPLANT
COVER SURGICAL LIGHT HANDLE (MISCELLANEOUS) ×2 IMPLANT
COVER WAND RF STERILE (DRAPES) ×2 IMPLANT
CUFF TOURN SGL QUICK 18X4 (TOURNIQUET CUFF) IMPLANT
CUFF TOURN SGL QUICK 24 (TOURNIQUET CUFF)
CUFF TRNQT CYL 24X4X16.5-23 (TOURNIQUET CUFF) IMPLANT
DECANTER SPIKE VIAL GLASS SM (MISCELLANEOUS) ×2 IMPLANT
DRAIN PENROSE 1/4X12 LTX STRL (WOUND CARE) IMPLANT
DRSG PAD ABDOMINAL 8X10 ST (GAUZE/BANDAGES/DRESSINGS) ×4 IMPLANT
DRSG XEROFORM 1X8 (GAUZE/BANDAGES/DRESSINGS) ×2 IMPLANT
GAUZE SPONGE 4X4 12PLY STRL (GAUZE/BANDAGES/DRESSINGS) ×2 IMPLANT
GAUZE SPONGE 4X4 12PLY STRL LF (GAUZE/BANDAGES/DRESSINGS) ×2 IMPLANT
GAUZE XEROFORM 1X8 LF (GAUZE/BANDAGES/DRESSINGS) ×2 IMPLANT
GLOVE BIO SURGEON STRL SZ7.5 (GLOVE) ×2 IMPLANT
GLOVE BIOGEL PI IND STRL 8.5 (GLOVE) IMPLANT
GLOVE BIOGEL PI INDICATOR 8.5 (GLOVE)
GLOVE BIOGEL PI ORTHO PRO SZ8 (GLOVE)
GLOVE PI ORTHO PRO STRL SZ8 (GLOVE) IMPLANT
GLOVE SRG 8 PF TXTR STRL LF DI (GLOVE) ×1 IMPLANT
GLOVE SURG ORTHO 8.0 STRL STRW (GLOVE) IMPLANT
GLOVE SURG UNDER POLY LF SZ8 (GLOVE) ×1
GOWN STRL REUS W/ TWL LRG LVL3 (GOWN DISPOSABLE) ×1 IMPLANT
GOWN STRL REUS W/ TWL XL LVL3 (GOWN DISPOSABLE) ×1 IMPLANT
GOWN STRL REUS W/TWL LRG LVL3 (GOWN DISPOSABLE) ×1
GOWN STRL REUS W/TWL XL LVL3 (GOWN DISPOSABLE) ×1
K-WIRE .035 (WIRE) ×2
K-WIRE DBL 4X.28 (WIRE) ×2
KIT BASIN OR (CUSTOM PROCEDURE TRAY) ×2 IMPLANT
KIT TURNOVER KIT B (KITS) ×2 IMPLANT
KWIRE .035 (WIRE) ×1 IMPLANT
KWIRE DBL 4X.28 (WIRE) ×1 IMPLANT
LOOP VESSEL MAXI BLUE (MISCELLANEOUS) IMPLANT
MANIFOLD NEPTUNE II (INSTRUMENTS) IMPLANT
NEEDLE HYPO 25X1 1.5 SAFETY (NEEDLE) IMPLANT
NS IRRIG 1000ML POUR BTL (IV SOLUTION) ×2 IMPLANT
PACK ORTHO EXTREMITY (CUSTOM PROCEDURE TRAY) ×2 IMPLANT
PAD ARMBOARD 7.5X6 YLW CONV (MISCELLANEOUS) ×4 IMPLANT
PADDING CAST SYNTHETIC 4 (CAST SUPPLIES) ×1
PADDING CAST SYNTHETIC 4X4 STR (CAST SUPPLIES) ×1 IMPLANT
SET CYSTO W/LG BORE CLAMP LF (SET/KITS/TRAYS/PACK) IMPLANT
SLING ARM IMMOBILIZER LRG (SOFTGOODS) ×2 IMPLANT
SOL PREP POV-IOD 4OZ 10% (MISCELLANEOUS) ×4 IMPLANT
SPLINT PLASTER EXTRA FAST 3X15 (CAST SUPPLIES) ×1
SPLINT PLASTER GYPS XFAST 3X15 (CAST SUPPLIES) ×1 IMPLANT
SPONGE LAP 4X18 RFD (DISPOSABLE) ×2 IMPLANT
SUT CHROMIC 4 0 PS 2 18 (SUTURE) ×2 IMPLANT
SUT ETHILON 4 0 P 3 18 (SUTURE) IMPLANT
SUT ETHILON 4 0 PS 2 18 (SUTURE) ×2 IMPLANT
SUT FIBERWIRE 4-0 18 DIAM BLUE (SUTURE) ×2
SUT MERSILENE 4 0 P 3 (SUTURE) ×2 IMPLANT
SUT MON AB 5-0 P3 18 (SUTURE) IMPLANT
SUTURE FIBERWR 4-0 18 DIA BLUE (SUTURE) ×1 IMPLANT
SWAB COLLECTION DEVICE MRSA (MISCELLANEOUS) IMPLANT
SWAB CULTURE ESWAB REG 1ML (MISCELLANEOUS) IMPLANT
SYR 20ML LL LF (SYRINGE) ×2 IMPLANT
SYR CONTROL 10ML LL (SYRINGE) IMPLANT
TOWEL GREEN STERILE (TOWEL DISPOSABLE) ×2 IMPLANT
TUBE CONNECTING 12X1/4 (SUCTIONS) ×2 IMPLANT
TUBE FEEDING ENTERAL 5FR 16IN (TUBING) IMPLANT
UNDERPAD 30X36 HEAVY ABSORB (UNDERPADS AND DIAPERS) ×2 IMPLANT
YANKAUER SUCT BULB TIP NO VENT (SUCTIONS) ×2 IMPLANT

## 2020-12-05 NOTE — Anesthesia Procedure Notes (Signed)
Procedure Name: Intubation Date/Time: 12/05/2020 11:20 PM Performed by: Claudina Lick, CRNA Pre-anesthesia Checklist: Patient identified, Emergency Drugs available, Suction available, Patient being monitored and Timeout performed Patient Re-evaluated:Patient Re-evaluated prior to induction Oxygen Delivery Method: Circle system utilized Preoxygenation: Pre-oxygenation with 100% oxygen Induction Type: IV induction, Rapid sequence and Cricoid Pressure applied Laryngoscope Size: Miller and 2 Grade View: Grade I Tube type: Oral Tube size: 7.5 mm Number of attempts: 1 Airway Equipment and Method: Stylet Placement Confirmation: ETT inserted through vocal cords under direct vision,  positive ETCO2 and breath sounds checked- equal and bilateral Secured at: 22 cm Tube secured with: Tape Dental Injury: Teeth and Oropharynx as per pre-operative assessment

## 2020-12-05 NOTE — ED Triage Notes (Signed)
States cuttig wood and cut left index finger with axe  States Laceration about 1/2" and deep

## 2020-12-05 NOTE — H&P (Signed)
Eddie Savage is an 30 y.o. male.   Chief Complaint: left hand laceration HPI: 30 yo male states he was using hatchet earlier today when it hit left hand.  Sustained laceration to left hand.  Seen at [facility] where XR revealed index finger metacarpal head fracture.  Transferred to [facility] for further care.  Reports no previous injury to left hand and no other injury at this time.  Case discussed with Virgina Norfolk, DO and his note from 12/05/2020 reviewed. Xrays viewed and interpreted by me: 3 views left hand show fracture left index metacarpal head with intraarticular extension. Labs reviewed: none  Allergies: No Known Allergies  History reviewed. No pertinent past medical history.  Past Surgical History:  Procedure Laterality Date  . WISDOM TOOTH EXTRACTION      Family History: History reviewed. No pertinent family history.  Social History:   reports that he has quit smoking. His smoking use included cigars. He has never used smokeless tobacco. He reports current alcohol use. He reports that he does not use drugs.  Medications: No medications prior to admission.    Results for orders placed or performed during the hospital encounter of 12/05/20 (from the past 48 hour(s))  Resp Panel by RT-PCR (Flu A&B, Covid) Nasopharyngeal Swab     Status: None   Collection Time: 12/05/20  8:40 PM   Specimen: Nasopharyngeal Swab; Nasopharyngeal(NP) swabs in vial transport medium  Result Value Ref Range   SARS Coronavirus 2 by RT PCR NEGATIVE NEGATIVE    Comment: (NOTE) SARS-CoV-2 target nucleic acids are NOT DETECTED.  The SARS-CoV-2 RNA is generally detectable in upper respiratory specimens during the acute phase of infection. The lowest concentration of SARS-CoV-2 viral copies this assay can detect is 138 copies/mL. A negative result does not preclude SARS-Cov-2 infection and should not be used as the sole basis for treatment or other patient management decisions. A negative result may occur with   improper specimen collection/handling, submission of specimen other than nasopharyngeal swab, presence of viral mutation(s) within the areas targeted by this assay, and inadequate number of viral copies(<138 copies/mL). A negative result must be combined with clinical observations, patient history, and epidemiological information. The expected result is Negative.  Fact Sheet for Patients:  BloggerCourse.com  Fact Sheet for Healthcare Providers:  SeriousBroker.it  This test is no t yet approved or cleared by the [place] FDA and  has been authorized for detection and/or diagnosis of SARS-CoV-2 by FDA under an Emergency Use Authorization (EUA). This EUA will remain  in effect (meaning this test can be used) for the duration of the COVID-19 declaration under Section 564(b)(1) of the Act, 21 U.S.C.section 360bbb-3(b)(1), unless the authorization is terminated  or revoked sooner.       Influenza A by PCR NEGATIVE NEGATIVE   Influenza B by PCR NEGATIVE NEGATIVE    Comment: (NOTE) The Xpert Xpress SARS-CoV-2/FLU/RSV plus assay is intended as an aid in the diagnosis of influenza from Nasopharyngeal swab specimens and should not be used as a sole basis for treatment. Nasal washings and aspirates are unacceptable for Xpert Xpress SARS-CoV-2/FLU/RSV testing.  Fact Sheet for Patients: BloggerCourse.com  Fact Sheet for Healthcare Providers: SeriousBroker.it  This test is not yet approved or cleared by the [place] FDA and has been authorized for detection and/or diagnosis of SARS-CoV-2 by FDA under an Emergency Use Authorization (EUA). This EUA will remain in effect (meaning this test can be used) for the duration of the COVID-19 declaration under Section 564(b)(1) of  the Act, 21 U.S.C. section 360bbb-3(b)(1), unless the authorization is terminated or revoked.  Performed at  [facility], [address] [place], [place] [address]     DG Hand Complete Left  Result Date: 12/05/2020 CLINICAL DATA:  Patient was chopping wood w/a small axe and axe head slipped and hit Lt 2nd metacarpal near MCP joint. Deep laceration at site. EXAM: LEFT HAND - COMPLETE 3+ VIEW COMPARISON:  None. FINDINGS: There is a minimally displaced fracture of the second metacarpal head, best seen on oblique and lateral views. There is intra-articular involvement. No dislocation. No other acute osseous abnormality in the left hand. No radiopaque foreign body. IMPRESSION: Minimally displaced fracture of the second metacarpal head with intra-articular involvement. In the setting of an overlying laceration this is considered an open fracture. Electronically Signed   By: Emmaline Kluver M.D.   On: 12/05/2020 19:32   DG MINI C-ARM IMAGE ONLY  Result Date: 12/05/2020 There is no interpretation for this exam.  This order is for images obtained during a surgical procedure.  Please See "Surgeries" Tab for more information regarding the procedure.     A comprehensive review of systems was negative. Review of Systems: No fevers, chills, night sweats, chest pain, shortness of breath, nausea, vomiting, diarrhea, constipation, easy bleeding or bruising, headaches, dizziness, vision changes, fainting.   Blood pressure 122/71, pulse 85, temperature 98.5 F (36.9 C), temperature source Oral, resp. rate 20, height 5\' 10"  (1.778 m), weight 68 kg, SpO2 100 %.  General appearance: alert, cooperative and appears stated age Head: Normocephalic, without obvious abnormality, atraumatic Neck: supple, symmetrical, trachea midline Resp: clear to auscultation bilaterally Cardio: regular rate and rhythm Extremities: Intact sensation and capillary refill all digits.  +epl/fpl/io.  Laceration dorsum left index at mp joint.  Able to extend index finger. Pulses: 2+ and symmetric Skin: Skin color, texture, turgor  normal. No rashes or lesions Neurologic: Grossly normal Incision/Wound: as above  Assessment/Plan Left index finger laceration with extensor tendon laceration and metacarpal head fracture.  Plan OR for irrigation and debridement, repair tendon, open reduction and pinning vs ORIF metacarpal fracture.  Risks, benefits and alternatives of surgery were discussed including risks of blood loss, infection, damage to nerves/vessels/tendons/ligament/bone, failure of surgery, need for additional surgery, complication with wound healing, stiffness.  He voiced understanding of these risks and elected to proceed.    12/05/2020, 11:08 PM

## 2020-12-05 NOTE — ED Provider Notes (Signed)
I personally evaluated the patient during the encounter and completed a history, physical, procedures, medical decision making to contribute to the overall care of the patient and decision making for the patient briefly, the patient is a 30 y.o. male who presents to the ED with laceration to his left index finger.  Appears to have open fracture as you can see down into his left index finger MCP.  Overall has fairly good flexion and extension of the finger.  X-ray shows minimally displaced fracture of the second metacarpal head with intra-articular involvement.  Overall this is an open fracture.  Talked with Dr. Merlyn Lot who recommends transfer over to [facility] for OR washout/repair.  Was given a dose of Ancef, tetanus shot updated.  Patient will drive a personal vehicle over to [facility] but he states that he will have family pick him up afterwards.  Neurovascularly neuromuscularly intact.  Stable throughout my care. NPO.   EKG Interpretation None           Virgina Norfolk, DO 12/05/20 2040

## 2020-12-05 NOTE — ED Provider Notes (Signed)
[facility] EMERGENCY DEPARTMENT Provider Note   CSN: [id number] Arrival date & time: 12/05/20  1830     History Chief Complaint  Patient presents with  . Finger Injury    States cut finger with axe into index finger    Eddie Savage is a 31 y.o. male.  HPI   30 y/o male presents for eval of left finger laceration. Pt was using an ax prior to arrival when he accidentally cut his left finger along the MCP joint of the left index finger.  Patient is a left-handed.  He denies any decreased sensation or other injuries.  No past medical history on file.  There are no problems to display for this patient.   Past Surgical History:  Procedure Laterality Date  . WISDOM TOOTH EXTRACTION         No family history on file.  Social History   Tobacco Use  . Smoking status: Former Smoker    Types: Cigars  . Smokeless tobacco: Never Used  Substance Use Topics  . Alcohol use: Yes    Comment: rarely  . Drug use: No    Home Medications Prior to Admission medications   Not on File    Allergies    Patient has no known allergies.  Review of Systems   Review of Systems  Constitutional: Negative for fever.  Respiratory: Negative for shortness of breath.   Cardiovascular: Negative for chest pain.  Gastrointestinal: Negative for vomiting.  Genitourinary: Negative for flank pain.  Musculoskeletal:       Left hand pain  Skin: Negative for wound.  Neurological: Negative for weakness and numbness.    Physical Exam Updated Vital Signs BP (!) 104/50   Pulse 83   Temp 98.5 F (36.9 C) (Oral)   Resp 17   Ht 5\' 10"  (1.778 m)   Wt 68 kg   SpO2 100%   BMI 21.52 kg/m   Physical Exam Vitals and nursing note reviewed.  Constitutional:      Appearance: He is well-developed.  HENT:     Head: Normocephalic and atraumatic.  Eyes:     Conjunctiva/sclera: Conjunctivae normal.  Cardiovascular:     Rate and Rhythm: Normal rate.  Pulmonary:     Effort: Pulmonary  effort is normal.  Abdominal:     General: Abdomen is flat.  Musculoskeletal:     Cervical back: Neck supple.     Comments: 1.5 cm laceration to the MCP joint of the right index finger. Laceration is deep and I am able to visualize the tendon. Cap refill is intact and sensation is intact. Strength appears to be intact all all joints.   Skin:    General: Skin is warm and dry.  Neurological:     Mental Status: He is alert.             ED Results / Procedures / Treatments   Labs (all labs ordered are listed, but only abnormal results are displayed) Labs Reviewed  RESP PANEL BY RT-PCR (FLU A&B, COVID) [id number]    EKG None  Radiology DG Hand Complete Left  Result Date: 12/05/2020 CLINICAL DATA:  Patient was chopping wood w/a small axe and axe head slipped and hit Lt 2nd metacarpal near MCP joint. Deep laceration at site. EXAM: LEFT HAND - COMPLETE 3+ VIEW COMPARISON:  None. FINDINGS: There is a minimally displaced fracture of the second metacarpal head, best seen on oblique and lateral views. There is intra-articular involvement. No dislocation. No other acute  osseous abnormality in the left hand. No radiopaque foreign body. IMPRESSION: Minimally displaced fracture of the second metacarpal head with intra-articular involvement. In the setting of an overlying laceration this is considered an open fracture. Electronically Signed   By: Emmaline Kluver M.D.   On: 12/05/2020 19:32    Procedures Procedures   Medications Ordered in ED Medications  Tdap (BOOSTRIX) injection 0.5 mL (has no administration in time range)  ceFAZolin (ANCEF) injection 1 g (has no administration in time range)  ceFAZolin (ANCEF) injection 1 g (has no administration in time range)    ED Course  I have reviewed the triage vital signs and the nursing notes.  Pertinent labs & imaging results that were available during my care of the patient were reviewed by me and considered in my medical decision making  (see chart for details).    MDM Rules/Calculators/A&P                          30 year old male presenting for evaluation of left finger injury while using an ax at work prior to arrival.  Has laceration over the MCP joint of the left index finger with intra-articular fracture.  Ancef was given in the ED.  Tdap was been updated.    Dr. Lockie Mola discussed the case with Dr. Merlyn Lot of hand surgery who recommended that the patient be transferred ED to ED and taken to the OR tonight.  8:43 PM Discussed case with Dr. Madilyn Hook EDP at [facility] who accepts patient for transfer.     Final Clinical Impression(s) / ED Diagnoses Final diagnoses:  Open nondisplaced fracture of proximal phalanx of left index finger, initial encounter    Rx / DC Orders ED Discharge Orders    None       Karrie Meres, PA-C 12/05/20 2048    Virgina Norfolk, DO 12/05/20 2205

## 2020-12-05 NOTE — Anesthesia Preprocedure Evaluation (Signed)
Anesthesia Evaluation  Patient identified by MRN, date of birth, ID band Patient awake    Reviewed: Allergy & Precautions, H&P , NPO status , Patient's Chart, lab work & pertinent test results  Airway Mallampati: II  TM Distance: >3 FB Neck ROM: Full    Dental no notable dental hx. (+) Teeth Intact, Dental Advisory Given   Pulmonary neg pulmonary ROS, former smoker,    Pulmonary exam normal breath sounds clear to auscultation       Cardiovascular negative cardio ROS   Rhythm:Regular Rate:Normal     Neuro/Psych negative neurological ROS  negative psych ROS   GI/Hepatic negative GI ROS, Neg liver ROS,   Endo/Other  negative endocrine ROS  Renal/GU negative Renal ROS  negative genitourinary   Musculoskeletal   Abdominal   Peds  Hematology negative hematology ROS (+)   Anesthesia Other Findings   Reproductive/Obstetrics negative OB ROS                             Anesthesia Physical Anesthesia Plan  ASA: I and emergent  Anesthesia Plan: General   Post-op Pain Management:    Induction: Intravenous, Rapid sequence and Cricoid pressure planned  PONV Risk Score and Plan: 3 and Ondansetron, Dexamethasone and Midazolam  Airway Management Planned: Oral ETT  Additional Equipment:   Intra-op Plan:   Post-operative Plan: Extubation in OR  Informed Consent: I have reviewed the patients History and Physical, chart, labs and discussed the procedure including the risks, benefits and alternatives for the proposed anesthesia with the patient or authorized representative who has indicated his/her understanding and acceptance.     Dental advisory given  Plan Discussed with: CRNA  Anesthesia Plan Comments:         Anesthesia Quick Evaluation

## 2020-12-05 NOTE — ED Notes (Signed)
Pt declined this RN call family about transfer and need for pick up at [facility]; pt says he will let them know; pt has cell phone with him

## 2020-12-06 ENCOUNTER — Encounter (INDEPENDENT_AMBULATORY_CARE_PROVIDER_SITE_OTHER): Payer: Self-pay

## 2020-12-06 ENCOUNTER — Encounter (HOSPITAL_COMMUNITY): Payer: Self-pay | Admitting: Orthopedic Surgery

## 2020-12-06 DIAGNOSIS — S62311B Displaced fracture of base of second metacarpal bone. left hand, initial encounter for open fracture: Secondary | ICD-10-CM | POA: Diagnosis not present

## 2020-12-06 MED ORDER — HYDROMORPHONE HCL 1 MG/ML IJ SOLN
0.2500 mg | INTRAMUSCULAR | Status: DC | PRN
  Administered 2020-12-06 (×2): 0.5 mg via INTRAVENOUS

## 2020-12-06 MED ORDER — SULFAMETHOXAZOLE-TRIMETHOPRIM 800-160 MG PO TABS: 1.0000 | ORAL_TABLET | Freq: Two times a day (BID) | ORAL | 0 refills | Status: DC

## 2020-12-06 MED ORDER — OXYCODONE-ACETAMINOPHEN 5-325 MG PO TABS: ORAL_TABLET | ORAL | 0 refills | Status: AC

## 2020-12-06 NOTE — Anesthesia Postprocedure Evaluation (Signed)
Anesthesia Post Note  Patient: Eddie Savage  Procedure(s) Performed: IRRIGATION AND DEBRIDEMENT, HAND  PINNING METACARPAL FRACTURE (Left Hand) OPEN REDUCTION INTERNAL FIXATION (ORIF) METACARPAL (Left Hand) REPAIR EXTENSOR TENDON AND COLATERAL LIGAMENT (Left Hand)     Patient location during evaluation: PACU Anesthesia Type: General Level of consciousness: awake and alert Pain management: pain level controlled Vital Signs Assessment: post-procedure vital signs reviewed and stable Respiratory status: spontaneous breathing, nonlabored ventilation and respiratory function stable Cardiovascular status: blood pressure returned to baseline and stable Postop Assessment: no apparent nausea or vomiting Anesthetic complications: no   No complications documented.  Last Vitals:  Vitals:   12/06/20 0135 12/06/20 0150  BP: 115/76 115/75  Pulse: 86 84  Resp: 11 11  Temp: 36.9 C 36.9 C  SpO2: 100% 97%    Last Pain:  Vitals:   12/06/20 0150  TempSrc:   PainSc: 0-No pain                 Kelina Beauchamp,W. EDMOND

## 2020-12-06 NOTE — Op Note (Addendum)
NAME: Eddie Savage MEDICAL RECORD NO: [id number] DATE OF BIRTH: 30-Mar-1991 FACILITY: [facility] LOCATION: [facility] OR PHYSICIAN: Tami Ribas, MD   OPERATIVE REPORT   DATE OF PROCEDURE: 12/06/20    PREOPERATIVE DIAGNOSIS:   Left index finger open intra-articular metacarpal head fracture and extensor tendon laceration   POSTOPERATIVE DIAGNOSIS:   1.  Left index finger open intra-articular metacarpal head fracture 2.  Left index finger extensor tendon laceration 3.  Left index finger radial collateral ligament laceration   PROCEDURE:   1.  Irrigation and debridement of left index finger open intra-articular metacarpal head fracture 2.  Open reduction pin fixation left index finger intra-articular metacarpal head fracture 3.  Left index finger repair of extensor tendon laceration at MP joint 4.  Left index finger repair of radial collateral ligament laceration   SURGEON:  Betha Loa, M.D.   ASSISTANT: none   ANESTHESIA:  General   INTRAVENOUS FLUIDS:  Per anesthesia flow sheet.   ESTIMATED BLOOD LOSS:  Minimal.   COMPLICATIONS:  None.   SPECIMENS:  none   TOURNIQUET TIME:   Left arm: 47 minutes at 250 mmHg   DISPOSITION:  Stable to PACU.  Debridement type: Excisional Debridement  Side: left  Body Location: index finger   Tools used for debridement: pickups   Debridement depth beyond dead/damaged tissue down to healthy viable tissue: no  Tissue layer involved: Other: removal contaminated hematoma from joint and fracture site  Nature of tissue removed: Other: contaiminated hematoma  Irrigation volume: 3000 cc     Irrigation fluid type: Normal Saline    INDICATIONS: 30 year old male states he was using a hatchet on 12/05/2020 when he accidentally hit his left hand.  He sustained laceration at the level of the index finger MP joint.  Was seen at [facility] where radiographs were taken revealing a metacarpal head fracture.  Was transferred to [facility] for  further care.  Recommended irrigation and debridement with fixation of the fracture and repair of tendon laceration in the operating room. Risks, benefits and alternatives of surgery were discussed including the risks of blood loss, infection, damage to nerves, vessels, tendons, ligaments, bone for surgery, need for additional surgery, complications with wound healing, continued pain, stiffness.  He voiced understanding of these risks and elected to proceed.  OPERATIVE COURSE:  After being identified preoperatively by myself,  the patient and I agreed on the procedure and site of the procedure.  The surgical site was marked.  Surgical consent had been signed. He was given IV antibiotics as preoperative antibiotic prophylaxis. He was transferred to the operating room and placed on the operating table in supine position with the Left upper extremity on an arm board.  General anesthesia was induced by the anesthesiologist.  Left upper extremity was prepped and draped in normal sterile orthopedic fashion.  A surgical pause was performed between the surgeons, anesthesia, and operating room staff and all were in agreement as to the patient, procedure, and site of procedure.  Tourniquet at the proximal aspect of the extremity was inflated to 250 mmHg after exsanguination of the arm with an Esmarch bandage.    The wound was explored.  There was no gross contamination.  There was laceration of the radial side of the sagittal bands of the extensor tendon.  The radial collateral ligament was lacerated.  There was fracture of the metacarpal head which was intra-articular.  The fracture was cleared of contaminated hematoma.  The fracture and MP joint were  copiously irrigated with sterile saline by cystoscopy tubing.  3000 cc was irrigated through the joint and open fracture.  The fracture was reduced under direct visualization.  A 0.045 inch K wire was advanced from distally through the distal aspect of the metacarpal head  across the fracture and into the proximal aspect of the metacarpal.  An additional 0.035 inch K wires advanced obliquely through the metacarpal head and into the proximal aspect of the metacarpal.  This was adequate stabilize the fracture and reduction.  The radial collateral ligament was repaired with a 4-0 FiberWire suture in a figure-of-eight fashion.  Good reapproximation was obtained.  The capsule was repaired with a 4-0 chromic suture in a figure-of-eight fashion.  The extensor tendon laceration was repaired with a 4-0 Mersilene in a running fashion.  Good reapproximation of the tendon laceration was obtained.  The skin was closed with 4-0 nylon in a horizontal mattress fashion.  The pins were bent and cut short.  A digital block was performed with quarter percent plain Marcaine to aid in postoperative analgesia.  The wound and pin sites were dressed with sterile Xeroform 4 x 4's and wrapped with a Kerlix bandage.  A volar and dorsal slab splint including the index long ring and small fingers was placed with the MPs flexed and the IP is extended.  This was wrapped with Kerlix and Ace bandage.  The tourniquet was deflated at 47 minutes.  Fingertips were pink with brisk capillary refill after deflation of tourniquet.  The operative  drapes were broken down.  The patient was awoken from anesthesia safely.  He was transferred back to the stretcher and taken to PACU in stable condition.  I will see him back in the office in 1 week for postoperative followup.  I will give him a prescription for Percocet 5/325 1-2 tabs PO q6 hours prn pain, dispense # 20 and Bactrim DS 1 p.o. twice daily x7 days.   Betha Loa, MD Electronically signed, 12/06/20   Addendum (12/10/20): Clarify debridement.

## 2020-12-06 NOTE — Transfer of Care (Signed)
Immediate Anesthesia Transfer of Care Note  Patient: Eddie Savage  Procedure(s) Performed: IRRIGATION AND DEBRIDEMENT, HAND  PINNING METACARPAL FRACTURE (Left Hand) OPEN REDUCTION INTERNAL FIXATION (ORIF) METACARPAL (Left Hand) REPAIR EXTENSOR TENDON AND COLATERAL LIGAMENT (Left Hand)  Patient Location: PACU  Anesthesia Type:General  Level of Consciousness: drowsy  Airway & Oxygen Therapy: Patient Spontanous Breathing  Post-op Assessment: Report given to RN and Post -op Vital signs reviewed and stable  Post vital signs: Reviewed and stable  Last Vitals:  Vitals Value Taken Time  BP 119/73 12/06/20 0105  Temp 36.7 C 12/06/20 0035  Pulse 92 12/06/20 0113  Resp 10 12/06/20 0113  SpO2 98 % 12/06/20 0113  Vitals shown include unvalidated device data.  Last Pain:  Vitals:   12/06/20 0110  TempSrc:   PainSc: 8          Complications: No complications documented.

## 2020-12-06 NOTE — Discharge Instructions (Addendum)

## 2021-01-19 ENCOUNTER — Emergency Department (HOSPITAL_BASED_OUTPATIENT_CLINIC_OR_DEPARTMENT_OTHER)
Admission: EM | Admit: 2021-01-19 | Discharge: 2021-01-19 | Disposition: A | Discharge status: Home / Self Care | Payer: 59 | Attending: Emergency Medicine | Admitting: Emergency Medicine

## 2021-01-19 ENCOUNTER — Other Ambulatory Visit: Payer: Self-pay

## 2021-01-19 ENCOUNTER — Encounter (HOSPITAL_BASED_OUTPATIENT_CLINIC_OR_DEPARTMENT_OTHER): Payer: Self-pay | Admitting: *Deleted

## 2021-01-19 DIAGNOSIS — Z87891 Personal history of nicotine dependence: Secondary | ICD-10-CM | POA: Insufficient documentation

## 2021-01-19 DIAGNOSIS — M25542 Pain in joints of left hand: Secondary | ICD-10-CM

## 2021-01-19 DIAGNOSIS — M79642 Pain in left hand: Secondary | ICD-10-CM | POA: Diagnosis present

## 2021-01-19 MED ORDER — OXYCODONE-ACETAMINOPHEN 5-325 MG PO TABS
1.0000 | ORAL_TABLET | Freq: Once | ORAL | Status: AC
Start: 2021-01-19 — End: 2021-01-19
  Administered 2021-01-19: 1 via ORAL
  Filled 2021-01-19: qty 1

## 2021-01-19 NOTE — Discharge Instructions (Signed)
Your EKG is normal-appearing. I have given you 1 dose of Percocet to take.  Please drink plenty of water and still your prescription tomorrow.  Please keep your appointment with your hand surgeon tomorrow.

## 2021-01-19 NOTE — ED Triage Notes (Signed)
Pt went to pick up his Vicodin on 5/9 and was given Pantoprazole 20 mg instead. He is here tonight with c.o pain to multiple places. He states he does not want a new Rx for Vicodin as it is at the pharmacy and he can pick it up tomorrow.

## 2021-01-19 NOTE — ED Provider Notes (Signed)
[facility] EMERGENCY DEPARTMENT Provider Note   CSN: [id number] Arrival date & time: 01/19/21  2105     History Chief Complaint  Patient presents with  . Joint Pain    Eddie Savage is a 30 y.o. male.  HPI Patient is a 30 year old male with a past medical history significant for open reduction and internal fixation of left second metacarpal  On 5/9 patient was supposed to fill up a prescription for Norco however it seems that he picked up pantoprazole. Since that time he has been taking 2 tablets every 6 hours as he was told his Norco prescription would be however he found out earlier today that he had been taking a stranger's medication that was given to him at his pharmacy.  He states that he has been taking pantoprazole 20 mg, 2 tablets at a time      History reviewed. No pertinent past medical history.  There are no problems to display for this patient.   Past Surgical History:  Procedure Laterality Date  . I & D EXTREMITY Left 12/05/2020   Procedure: IRRIGATION AND DEBRIDEMENT, HAND  PINNING METACARPAL FRACTURE;  Surgeon: Betha Loa, MD;  Location: [facility] OR;  Service: Orthopedics;  Laterality: Left;  . OPEN REDUCTION INTERNAL FIXATION (ORIF) METACARPAL Left 12/05/2020   Procedure: OPEN REDUCTION INTERNAL FIXATION (ORIF) METACARPAL;  Surgeon: Betha Loa, MD;  Location: [facility] OR;  Service: Orthopedics;  Laterality: Left;  . REPAIR EXTENSOR TENDON Left 12/05/2020   Procedure: REPAIR EXTENSOR TENDON AND COLATERAL LIGAMENT;  Surgeon: Betha Loa, MD;  Location: [facility] OR;  Service: Orthopedics;  Laterality: Left;  . WISDOM TOOTH EXTRACTION         No family history on file.  Social History   Tobacco Use  . Smoking status: Former Smoker    Types: Cigars  . Smokeless tobacco: Never Used  Substance Use Topics  . Alcohol use: Yes    Comment: rarely  . Drug use: No    Home Medications Prior to Admission medications   Medication Sig Start Date End Date Taking?  Authorizing Provider  pantoprazole (PROTONIX) 20 MG tablet Take 20 mg by mouth daily.   Yes [provider]  oxyCODONE-acetaminophen (PERCOCET) 5-325 MG tablet 1-2 tabs po q6 hours prn pain 12/06/20   Betha Loa, MD  sulfamethoxazole-trimethoprim (BACTRIM DS) 800-160 MG tablet Take 1 tablet by mouth 2 (two) times daily. 12/06/20   Betha Loa, MD    Allergies    Patient has no known allergies.  Review of Systems   Review of Systems  Constitutional: Negative for fever.  HENT: Negative for congestion.   Eyes: Negative for pain.  Respiratory: Negative for cough.   Cardiovascular: Negative for chest pain.  Gastrointestinal: Negative for abdominal pain, nausea and vomiting.  Genitourinary: Negative for dysuria.  Musculoskeletal: Negative for myalgias.  Skin: Negative for rash.  Neurological: Negative for dizziness and headaches.  Psychiatric/Behavioral: Positive for agitation.    Physical Exam Updated Vital Signs BP 123/76   Pulse 83   Temp 99 F (37.2 C) (Oral)   Resp 20   Ht 5\' 10"  (1.778 m)   Wt 68 kg   SpO2 100%   BMI 21.51 kg/m   Physical Exam Vitals and nursing note reviewed.  Constitutional:      General: He is not in acute distress.    Appearance: Normal appearance. He is not ill-appearing.  HENT:     Head: Normocephalic and atraumatic.     Mouth/Throat:  Mouth: Mucous membranes are moist.  Eyes:     General: No scleral icterus.       Right eye: No discharge.        Left eye: No discharge.     Conjunctiva/sclera: Conjunctivae normal.  Pulmonary:     Effort: Pulmonary effort is normal.     Breath sounds: No stridor.  Abdominal:     Palpations: Abdomen is soft.     Tenderness: There is no abdominal tenderness.  Musculoskeletal:     Comments: There are 2x metal pins protruding from left second metacarpal  No warmth or redness or significant tenderness to touch  Skin:    Comments: Somewhat dry appearing skin of the bilateral palm   Neurological:     Mental Status: He is alert and oriented to person, place, and time. Mental status is at baseline.     ED Results / Procedures / Treatments   Labs (all labs ordered are listed, but only abnormal results are displayed) Labs Reviewed - No data to display  EKG EKG Interpretation  Date/Time:  Tuesday Jan 19 2021 23:10:54 EDT Ventricular Rate:  78 PR Interval:  148 QRS Duration: 91 QT Interval:  373 QTC Calculation: 425 R Axis:   94 Text Interpretation: Sinus rhythm Borderline right axis deviation No previous ECGs available Confirmed by Paula Libra ([id number]) on 01/19/2021 11:16:13 PM   Radiology No results found.  Procedures Procedures   Medications Ordered in ED Medications  oxyCODONE-acetaminophen (PERCOCET/ROXICET) 5-325 MG per tablet 1 tablet (1 tablet Oral Given 01/19/21 2341)    ED Course  I have reviewed the triage vital signs and the nursing notes.  Pertinent labs & imaging results that were available during my care of the patient were reviewed by me and considered in my medical decision making (see chart for details).  Clinical Course as of 01/20/21 0005  Tue Jan 19, 2021  2335 Discussed with Caryn Bee of poison control who states no labs necessary. I reassured pt. Symptomsof joint pain likely due to under/no-treatment of pt's post-op pain.  [WF]    Clinical Course User Index [WF] Gailen Shelter, PA   MDM Rules/Calculators/A&P                          Patient is been taking PPI instead of Norco.  Overall very few side effects from this.  He has been taking a relatively high dose but not outside of the normal limits of prescription.  Given reassuring EKG and no concern from poison control and will discharge patient home at this time. Given 1 dose of Percocet given that he has been without pain medication for nearly 1 month now.  Final Clinical Impression(s) / ED Diagnoses Final diagnoses:  Arthralgia of left hand    Rx / DC Orders ED  Discharge Orders    None       Gailen Shelter, [place] 01/20/21 0006    Paula Libra, MD 01/20/21 [phone]

## 2022-10-25 ENCOUNTER — Encounter: Payer: Self-pay | Admitting: Radiology

## 2022-10-25 ENCOUNTER — Other Ambulatory Visit: Payer: Self-pay

## 2022-10-25 ENCOUNTER — Emergency Department
Admission: EM | Admit: 2022-10-25 | Discharge: 2022-10-25 | Disposition: A | Discharge status: Home / Self Care | Payer: 59 | Attending: Emergency Medicine | Admitting: Emergency Medicine

## 2022-10-25 ENCOUNTER — Emergency Department: Payer: 59

## 2022-10-25 DIAGNOSIS — M545 Low back pain, unspecified: Secondary | ICD-10-CM | POA: Insufficient documentation

## 2022-10-25 DIAGNOSIS — Y92002 Bathroom of unspecified non-institutional (private) residence single-family (private) house as the place of occurrence of the external cause: Secondary | ICD-10-CM | POA: Insufficient documentation

## 2022-10-25 DIAGNOSIS — W010XXA Fall on same level from slipping, tripping and stumbling without subsequent striking against object, initial encounter: Secondary | ICD-10-CM | POA: Insufficient documentation

## 2022-10-25 MED ORDER — ONDANSETRON HCL 4 MG/2ML IJ SOLN
4.0000 mg | Freq: Once | INTRAMUSCULAR | Status: AC
  Administered 2022-10-25: 4 mg via INTRAVENOUS
  Filled 2022-10-25: qty 2

## 2022-10-25 MED ORDER — BACLOFEN 10 MG PO TABS: 10.0000 mg | ORAL_TABLET | Freq: Every day | ORAL | 0 refills | Status: AC

## 2022-10-25 MED ORDER — KETOROLAC TROMETHAMINE 30 MG/ML IJ SOLN
30.0000 mg | Freq: Once | INTRAMUSCULAR | Status: AC
  Administered 2022-10-25: 30 mg via INTRAVENOUS
  Filled 2022-10-25: qty 1

## 2022-10-25 MED ORDER — MORPHINE SULFATE (PF) 4 MG/ML IV SOLN
4.0000 mg | Freq: Once | INTRAVENOUS | Status: AC
  Administered 2022-10-25: 4 mg via INTRAVENOUS
  Filled 2022-10-25: qty 1

## 2022-10-25 NOTE — ED Triage Notes (Signed)
Pt slipped and fell last night in the bathroom. Pt states he landed on his back when he fell and hit his head . Pt states he is unable to sit up due to back pain. Pt also endorses chronic back pain. But this pain is unbearable. Pt reports no pain pills but is okay with IV pain meds. Pt states he was on pain pills for a year.

## 2022-10-25 NOTE — ED Provider Notes (Signed)
[facility] Provider Note    Event Date/Time   First MD Initiated Contact with Patient 10/25/22 1708     (approximate)  History   Chief Complaint: Back Pain  HPI  Eddie Savage is a 32 y.o. male with a past medical history of chronic lower back pain who presents to the emergency department for exacerbation of lower back pain.  According to the patient he was getting his child out of the bathtub yesterday when he slipped on the bathroom floor falling backwards landing on his back.  Patient states a history of chronic lower back pain but has been acutely exacerbated since the fall yesterday.  Patient denies any weakness or numbness in either leg.  Denies any radiation of the pain down either leg.  Patient states he may have hit his head but denies any LOC denies any headache nausea or vomiting.  Patient denies any incontinence.  Physical Exam   Triage Vital Signs: ED Triage Vitals  Enc Vitals Group     BP 10/25/22 1712 111/69     Pulse Rate 10/25/22 1712 90     Resp 10/25/22 1712 17     Temp 10/25/22 1712 98.6 F (37 C)     Temp Source 10/25/22 1712 Oral     SpO2 10/25/22 1712 98 %     Weight 10/25/22 1717 149 lb (67.6 kg)     Height 10/25/22 1717 '5\' 10"'$  (1.778 m)     Head Circumference --      Peak Flow --      Pain Score --      Pain Loc --      Pain Edu? --      Excl. in [place]? --     Most recent vital signs: Vitals:   10/25/22 1714 10/25/22 1716  BP: 111/69   Pulse: 88   Resp: 17   Temp:    SpO2: 94% 98%    General: Awake, no distress.  CV:  Good peripheral perfusion.  Regular rate and rhythm  Resp:  Normal effort.  Equal breath sounds bilaterally.  Abd:  No distention.  Soft, nontender.  No rebound or guarding. Other:  Good range of motion in bilateral lower extremities but does state pain in the lower back with range of motion.  Sensation intact and equal bilaterally.  Good strength.   ED Results / Procedures / Treatments    RADIOLOGY  I have reviewed and interpreted the CT images.  No fracture seen on my evaluation. Radiology is read the CT scan is negative for acute abnormality.   MEDICATIONS ORDERED IN ED: Medications  morphine (PF) 4 MG/ML injection 4 mg (4 mg Intravenous Given 10/25/22 1733)  ondansetron (ZOFRAN) injection 4 mg (4 mg Intravenous Given 10/25/22 1733)     IMPRESSION / MDM / ASSESSMENT AND PLAN / ED COURSE  I reviewed the triage vital signs and the nursing notes.  Patient's presentation is most consistent with acute presentation with potential threat to life or bodily function.  Patient presents emergency department for lower back pain.  History of chronic lower back pain but acutely worse since a fall yesterday.  Overall the patient appears well, no distress.  No neurologic findings.  Denies any weakness or numbness or incontinence.  Given the fall with increased pain we will obtain CT imaging of the lower back to rule out acute fracture.  Will treat pain while awaiting CT imaging.  Patient agreeable to plan.  CT scan is reassuring.  Patient is feeling better after medications.  Will discharge with PCP follow-up.  FINAL CLINICAL IMPRESSION(S) / ED DIAGNOSES   Lower back pain, acute on chronic    Note:  This document was prepared using Dragon voice recognition software and may include unintentional dictation errors.   Harvest Dark, MD 10/25/22 1755

## 2023-02-10 IMAGING — CR DG HAND COMPLETE 3+V*L*
3 series · 3 of 3 positions shown · non-contrast
Comparison: None.

CLINICAL DATA: Patient was chopping [employer] and axe head
slipped and hit Lt 2nd metacarpal near MCP joint. Deep laceration at
site.

EXAM:
LEFT HAND - COMPLETE 3+ VIEW

[x hand pa left]
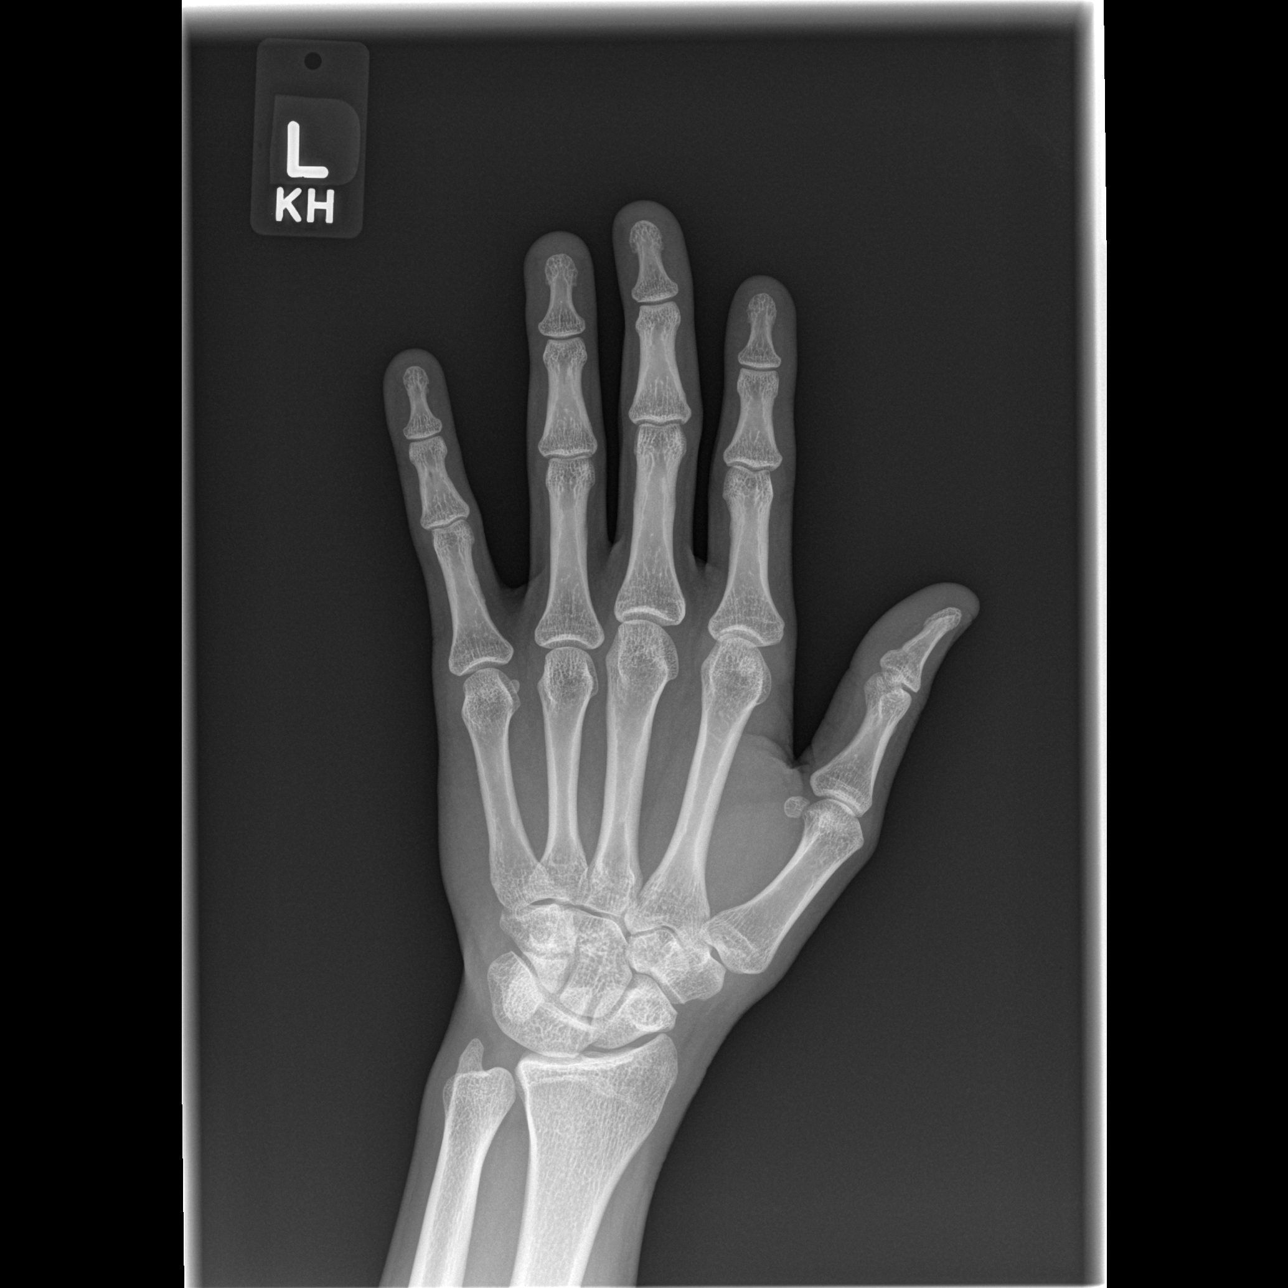

[x hand oblique left]
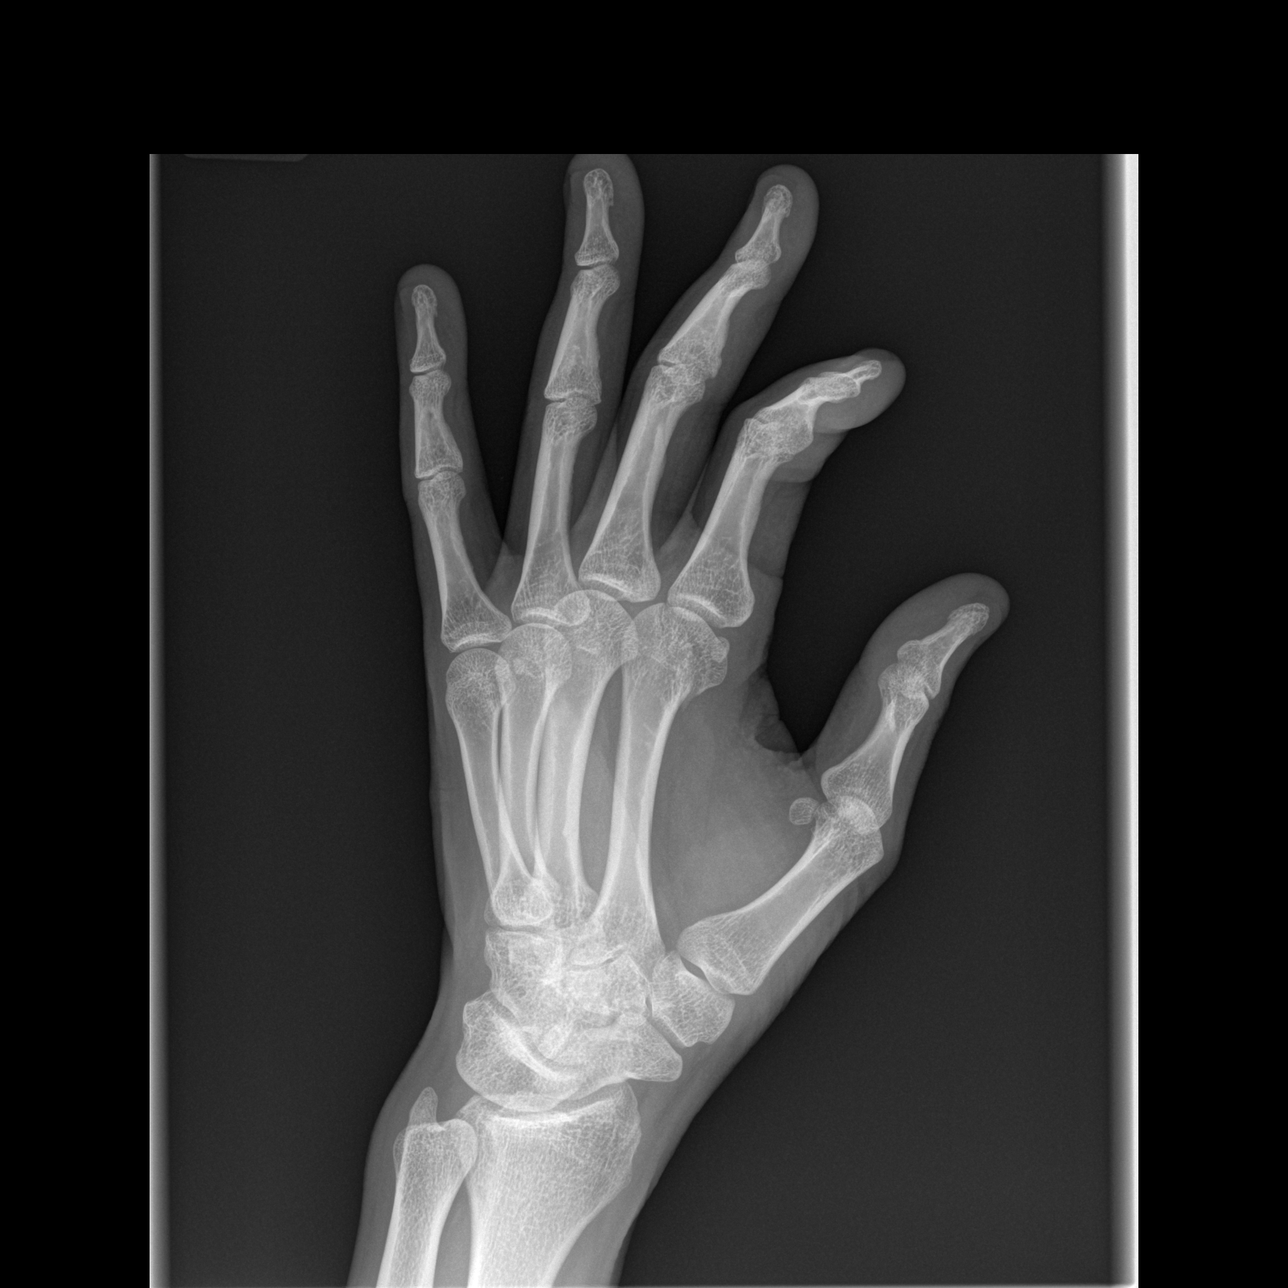

[x hand lat left]
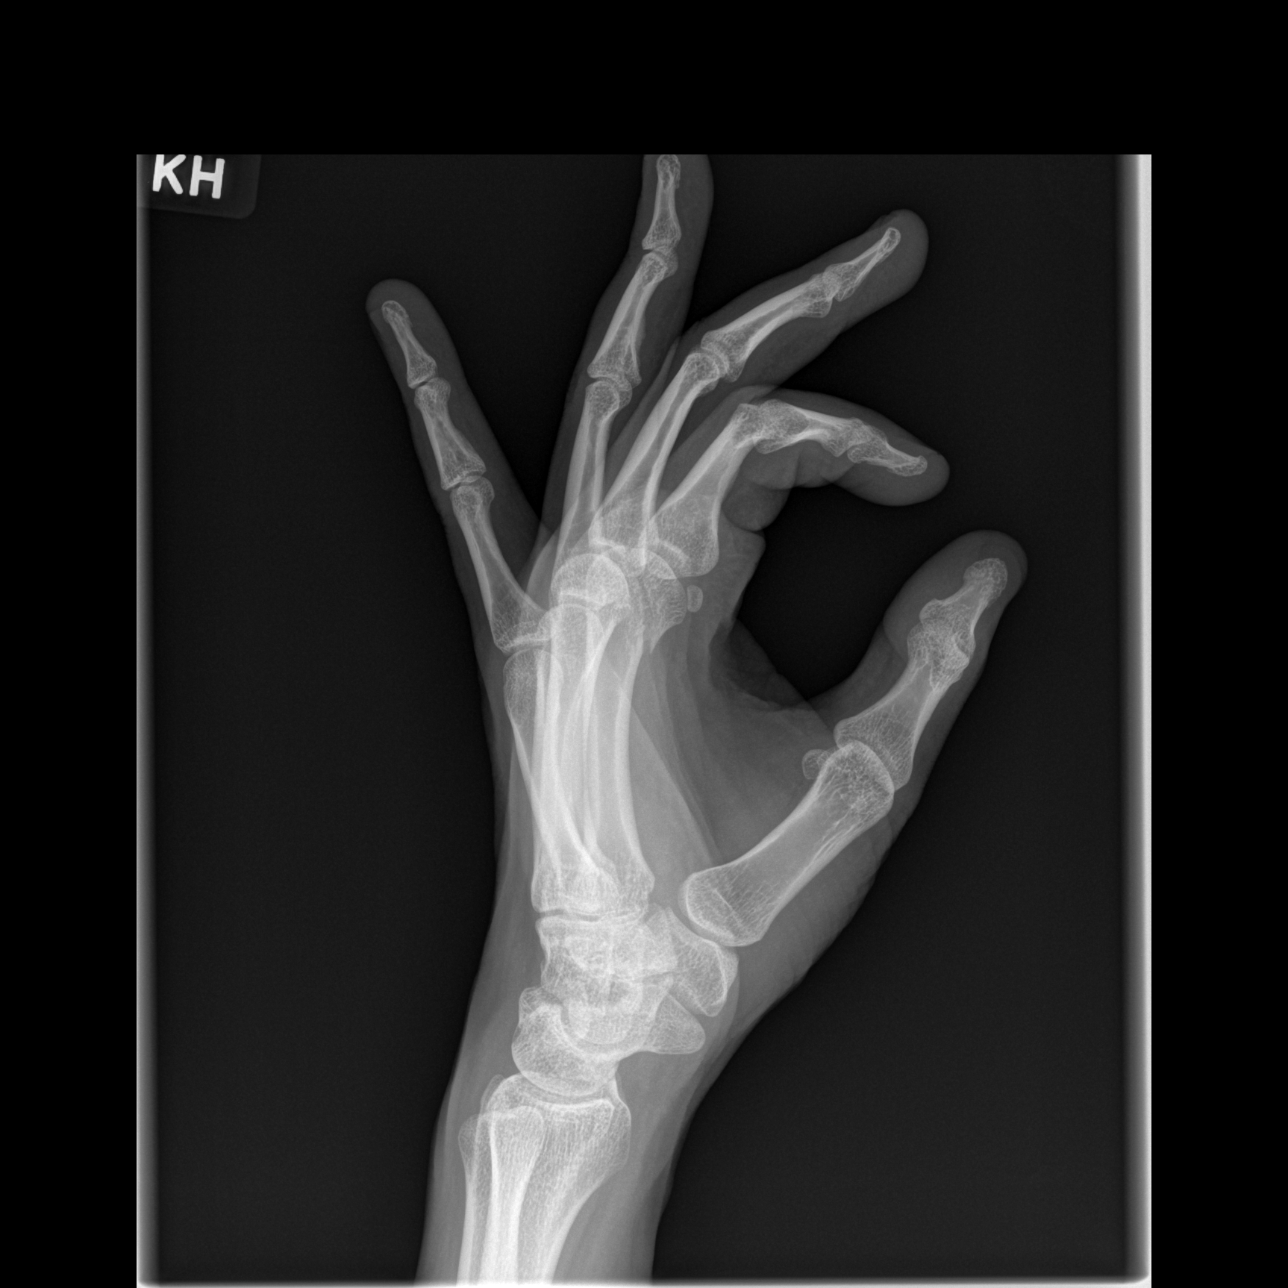

[3 of 3 positions shown; findings below may reference images not displayed]

FINDINGS: There is a minimally displaced fracture of the second metacarpal
head, best seen on oblique and lateral views. There is
intra-articular involvement. No dislocation. No other acute osseous
abnormality in the left hand. No radiopaque foreign body.
IMPRESSION: Minimally displaced fracture of the second metacarpal head with
intra-articular involvement. In the setting of an overlying
laceration this is considered an open fracture.

## 2024-01-02 ENCOUNTER — Emergency Department
Admission: EM | Admit: 2024-01-02 | Discharge: 2024-01-03 | Disposition: A | Discharge status: Home / Self Care | Payer: Self-pay | Attending: Emergency Medicine | Admitting: Emergency Medicine

## 2024-01-02 ENCOUNTER — Emergency Department: Payer: Self-pay

## 2024-01-02 ENCOUNTER — Other Ambulatory Visit: Payer: Self-pay

## 2024-01-02 DIAGNOSIS — R079 Chest pain, unspecified: Secondary | ICD-10-CM

## 2024-01-02 DIAGNOSIS — S39012A Strain of muscle, fascia and tendon of lower back, initial encounter: Secondary | ICD-10-CM | POA: Insufficient documentation

## 2024-01-02 DIAGNOSIS — R0789 Other chest pain: Secondary | ICD-10-CM | POA: Insufficient documentation

## 2024-01-02 DIAGNOSIS — X58XXXA Exposure to other specified factors, initial encounter: Secondary | ICD-10-CM | POA: Insufficient documentation

## 2024-01-02 DIAGNOSIS — S29012A Strain of muscle and tendon of back wall of thorax, initial encounter: Secondary | ICD-10-CM | POA: Insufficient documentation

## 2024-01-02 DIAGNOSIS — S29019A Strain of muscle and tendon of unspecified wall of thorax, initial encounter: Secondary | ICD-10-CM

## 2024-01-02 LAB — CBC
HCT: 45.6 % (ref 39.0–52.0)
Hemoglobin: 15.2 g/dL (ref 13.0–17.0)
MCH: 29.7 pg (ref 26.0–34.0)
MCHC: 33.3 g/dL (ref 30.0–36.0)
MCV: 89.1 fL (ref 80.0–100.0)
Platelets: 272 10*3/uL (ref 150–400)
RBC: 5.12 MIL/uL (ref 4.22–5.81)
RDW: 12.4 % (ref 11.5–15.5)
WBC: 7.8 10*3/uL (ref 4.0–10.5)
nRBC: 0 % (ref 0.0–0.2)

## 2024-01-02 LAB — BASIC METABOLIC PANEL WITH GFR
Anion gap: 9 (ref 5–15)
BUN: 12 mg/dL (ref 6–20)
CO2: 25 mmol/L (ref 22–32)
Calcium: 9.2 mg/dL (ref 8.9–10.3)
Chloride: 105 mmol/L (ref 98–111)
Creatinine, Ser: 0.96 mg/dL (ref 0.61–1.24)
GFR, Estimated: >60 mL/min (ref >60)
Glucose, Bld: 124 mg/dL — ABNORMAL HIGH (ref 70–99)
Potassium: 3.7 mmol/L (ref 3.5–5.1)
Sodium: 139 mmol/L (ref 135–145)

## 2024-01-02 LAB — TROPONIN I (HIGH SENSITIVITY): Troponin I (High Sensitivity): 3 ng/L (ref <18)

## 2024-01-02 MED ORDER — KETOROLAC TROMETHAMINE 30 MG/ML IJ SOLN
15.0000 mg | Freq: Once | INTRAMUSCULAR | Status: AC
  Administered 2024-01-02: 15 mg via INTRAVENOUS
  Filled 2024-01-02: qty 1

## 2024-01-02 MED ORDER — FAMOTIDINE IN NACL 20-0.9 MG/50ML-% IV SOLN
20.0000 mg | Freq: Once | INTRAVENOUS | Status: AC
  Administered 2024-01-02: 20 mg via INTRAVENOUS
  Filled 2024-01-02: qty 50

## 2024-01-02 NOTE — ED Notes (Signed)
 Pt ambulated to treatment room independently with steady gait. No signs of distress observed at this time.

## 2024-01-02 NOTE — ED Provider Notes (Incomplete)
 [facility] Provider Note    Event Date/Time   First MD Initiated Contact with Patient 01/02/24 2321     (approximate)   History   Chest Pain   HPI {Remember to add pertinent medical, surgical, social, and/or OB history to HPI:1} Eddie Savage is a 33 y.o. male  ***       Past Medical History  History reviewed. No pertinent past medical history.   Active Problem List  There are no active problems to display for this patient.    Past Surgical History   Past Surgical History:  Procedure Laterality Date  . I & D EXTREMITY Left 12/05/2020   Procedure: IRRIGATION AND DEBRIDEMENT, HAND  PINNING METACARPAL FRACTURE;  Surgeon: Brunilda Capra, MD;  Location: [facility] OR;  Service: Orthopedics;  Laterality: Left;  . OPEN REDUCTION INTERNAL FIXATION (ORIF) METACARPAL Left 12/05/2020   Procedure: OPEN REDUCTION INTERNAL FIXATION (ORIF) METACARPAL;  Surgeon: Brunilda Capra, MD;  Location: [facility] OR;  Service: Orthopedics;  Laterality: Left;  . REPAIR EXTENSOR TENDON Left 12/05/2020   Procedure: REPAIR EXTENSOR TENDON AND COLATERAL LIGAMENT;  Surgeon: Brunilda Capra, MD;  Location: [facility] OR;  Service: Orthopedics;  Laterality: Left;  . WISDOM TOOTH EXTRACTION       Home Medications   Prior to Admission medications   Medication Sig Start Date End Date Taking? Authorizing Provider  oxyCODONE -acetaminophen  (PERCOCET) 5-325 MG tablet 1-2 tabs po q6 hours prn pain 12/06/20   Kuzma, Kevin, MD  pantoprazole (PROTONIX) 20 MG tablet Take 20 mg by mouth daily. Pt called and said he has taken this meds for 2 weeks before he saw that this was not his med,  that pharmacy had given him someone else meds    [provider]  sulfamethoxazole -trimethoprim  (BACTRIM  DS) 800-160 MG tablet Take 1 tablet by mouth 2 (two) times daily. 12/06/20   Kuzma, Kevin, MD     Allergies  Patient has no known allergies.   Family History  History reviewed. No pertinent family  history.   Physical Exam  Triage Vital Signs: ED Triage Vitals  Encounter Vitals Group     BP 01/02/24 2249 131/81     Systolic BP Percentile --      Diastolic BP Percentile --      Pulse Rate 01/02/24 2249 90     Resp 01/02/24 2249 18     Temp 01/02/24 2249 98.3 F (36.8 C)     Temp Source 01/02/24 2249 Oral     SpO2 01/02/24 2249 100 %     Weight 01/02/24 2245 128 lb (58.1 kg)     Height 01/02/24 2245 5\' 10"  (1.778 m)     Head Circumference --      Peak Flow --      Pain Score 01/02/24 2245 10     Pain Loc --      Pain Education --      Exclude from Growth Chart --     Updated Vital Signs: BP 131/81   Pulse 90   Temp 98.3 F (36.8 C) (Oral)   Resp 18   Ht 5\' 10"  (1.778 m)   Wt 58.1 kg   SpO2 100%   BMI 18.37 kg/m   {Only need to document appropriate and relevant physical exam:1} General: Awake, no distress. *** CV:  Good peripheral perfusion. *** Resp:  Normal effort. *** Abd:  No distention. *** Other:  ***   ED Results / Procedures / Treatments  Labs (all labs ordered  are listed, but only abnormal results are displayed) Labs Reviewed  BASIC METABOLIC PANEL WITH GFR - Abnormal; Notable for the following components:      Result Value   Glucose, Bld 124 (*)    All other components within normal limits  CBC  TROPONIN I (HIGH SENSITIVITY)     EKG  ***   RADIOLOGY *** {You MUST document your own interpretation of imaging, as well as the fact that you reviewed the radiologist's report!:1}  Official radiology report(s): No results found.   PROCEDURES:  Critical Care performed: {CriticalCareYesNo:19197::"Yes, see critical care procedure note(s)","No"}  Procedures   MEDICATIONS ORDERED IN ED: Medications - No data to display   IMPRESSION / MDM / ASSESSMENT AND PLAN / ED COURSE  I reviewed the triage vital signs and the nursing notes.                              Differential diagnosis includes, but is not limited to, ***  Patient's  presentation is most consistent with {EM COPA:27473}  {If the patient is on the monitor, remove the brackets and asterisks on the sentence below and remember to document it as a Procedure as well. Otherwise delete the sentence below:1} {**The patient is on the cardiac monitor to evaluate for evidence of arrhythmia and/or significant heart rate changes.**}  {Remember to include, when applicable, any/all of the following data: independent review of imaging independent review of labs (comment specifically on pertinent positives and negatives) review of specific prior hospitalizations, PCP/specialist notes, etc. discuss meds given and prescribed document any discussion with consultants (including hospitalists) any clinical decision tools you used and why (PECARN, NEXUS, etc.) did you consider admitting the patient? document social determinants of health affecting patient's care (homelessness, inability to follow up in a timely fashion, etc) document any pre-existing conditions increasing risk on current visit (e.g. diabetes and HTN increasing danger of high-risk chest pain/ACS) describes what meds you gave (especially parenteral) and why any other interventions?:1}      FINAL CLINICAL IMPRESSION(S) / ED DIAGNOSES   Final diagnoses:  Chest pain, unspecified type     Rx / DC Orders   ED Discharge Orders     None        Note:  This document was prepared using Dragon voice recognition software and may include unintentional dictation errors.

## 2024-01-02 NOTE — ED Triage Notes (Signed)
 Pt to ED via POV c/o central CP that radiates to back. Pt reports this has been going on intermittently since February. Pt reports this instance of CP has been going on all day. Denies SOB, fevers, dizziness. Denies any med hx

## 2024-01-02 NOTE — ED Provider Notes (Signed)
 [facility] Provider Note    Event Date/Time   First MD Initiated Contact with Patient 01/02/24 2321     (approximate)   History   Chest Pain   HPI  Eddie Savage is a 33 y.o. male who presents to the ED from home with a chief complaint of chest and back pain waxing/waning since February of this year.  Patient is in the [employer] and reports at the end of January he went on a several miles long hike carrying an almost 100 pound pack.  This started his upper back pain and intermittent central chest pain which she describes as a tightness.  Chest pain not associated with diaphoresis, shortness of breath, nausea/vomiting, palpitations or dizziness.  Patient is here tonight because his mother recently received a pacemaker and he decided to undergo medical evaluation for his chest pain and back pain.  Patient is a non-smoker.  Denies illicit drug use.  Denies hormone use.  Denies recent travel, trauma or hormone use.     Past Medical History  History reviewed. No pertinent past medical history.   Active Problem List  There are no active problems to display for this patient.    Past Surgical History   Past Surgical History:  Procedure Laterality Date  . I & D EXTREMITY Left 12/05/2020   Procedure: IRRIGATION AND DEBRIDEMENT, HAND  PINNING METACARPAL FRACTURE;  Surgeon: Brunilda Capra, MD;  Location: [facility] OR;  Service: Orthopedics;  Laterality: Left;  . OPEN REDUCTION INTERNAL FIXATION (ORIF) METACARPAL Left 12/05/2020   Procedure: OPEN REDUCTION INTERNAL FIXATION (ORIF) METACARPAL;  Surgeon: Brunilda Capra, MD;  Location: [facility] OR;  Service: Orthopedics;  Laterality: Left;  . REPAIR EXTENSOR TENDON Left 12/05/2020   Procedure: REPAIR EXTENSOR TENDON AND COLATERAL LIGAMENT;  Surgeon: Brunilda Capra, MD;  Location: [facility] OR;  Service: Orthopedics;  Laterality: Left;  . WISDOM TOOTH EXTRACTION       Home Medications   Prior to Admission medications   Medication Sig Start  Date End Date Taking? Authorizing Provider  oxyCODONE -acetaminophen  (PERCOCET) 5-325 MG tablet 1-2 tabs po q6 hours prn pain 12/06/20   Kuzma, Kevin, MD  pantoprazole (PROTONIX) 20 MG tablet Take 20 mg by mouth daily. Pt called and said he has taken this meds for 2 weeks before he saw that this was not his med,  that pharmacy had given him someone else meds    [provider]  sulfamethoxazole -trimethoprim  (BACTRIM  DS) 800-160 MG tablet Take 1 tablet by mouth 2 (two) times daily. 12/06/20   Kuzma, Kevin, MD     Allergies  Patient has no known allergies.   Family History  History reviewed. No pertinent family history.   Physical Exam  Triage Vital Signs: ED Triage Vitals  Encounter Vitals Group     BP 01/02/24 2249 131/81     Systolic BP Percentile --      Diastolic BP Percentile --      Pulse Rate 01/02/24 2249 90     Resp 01/02/24 2249 18     Temp 01/02/24 2249 98.3 F (36.8 C)     Temp Source 01/02/24 2249 Oral     SpO2 01/02/24 2249 100 %     Weight 01/02/24 2245 128 lb (58.1 kg)     Height 01/02/24 2245 5\' 10"  (1.778 m)     Head Circumference --      Peak Flow --      Pain Score 01/02/24 2245 10     Pain  Loc --      Pain Education --      Exclude from Growth Chart --     Updated Vital Signs: BP 131/81   Pulse 90   Temp 98.3 F (36.8 C) (Oral)   Resp 18   Ht 5\' 10"  (1.778 m)   Wt 58.1 kg   SpO2 100%   BMI 18.37 kg/m    General: Awake, no distress.  CV:  RRR.  Good peripheral perfusion.  Resp:  Normal effort.  CTAB. Abd:  Nontender.  No distention.  Other:  Bilateral calves are supple and nontender.  Mild thoracic back stiffness.  Lumbar spine nontender to palpation.  Paraspinal lumbar stiffness/muscle spasms.  Negative straight leg raise.  5/5 motor strength and sensation BLE.   ED Results / Procedures / Treatments  Labs (all labs ordered are listed, but only abnormal results are displayed) Labs Reviewed  BASIC METABOLIC PANEL WITH GFR -  Abnormal; Notable for the following components:      Result Value   Glucose, Bld 124 (*)    All other components within normal limits  CBC  HEPATIC FUNCTION PANEL  LIPASE, BLOOD  CK  D-DIMER, QUANTITATIVE  TROPONIN I (HIGH SENSITIVITY)  TROPONIN I (HIGH SENSITIVITY)     EKG  ED ECG REPORT I, Winefred Hillesheim J, the attending physician, personally viewed and interpreted this ECG.   Date: 01/02/2024  EKG Time: 2246  Rate: 90  Rhythm: normal sinus rhythm  Axis: Normal  Intervals: RBBB  ST&T Change: Nonspecific    RADIOLOGY I have independently visualized interpreted patient's imaging study as well as noted the radiology interpretation:  Chest x-ray: No acute cardiopulmonary process  Official radiology report(s): DG Chest 2 View Result Date: 01/02/2024 CLINICAL DATA:  Central chest pain radiating to back EXAM: CHEST - 2 VIEW COMPARISON:  Frontal and lateral views of the chest demonstrate an FINDINGS: Unremarkable cardiac silhouette. No acute airspace disease, effusion, or pneumothorax. Pectus excavatum deformity. No acute bony abnormalities. IMPRESSION: 1. No acute intrathoracic process. Electronically Signed   By: Bobbye Burrow M.D.   On: 01/02/2024 23:23     PROCEDURES:  Critical Care performed: No  .1-3 Lead EKG Interpretation  Performed by: Norlene Beavers, MD Authorized by: Norlene Beavers, MD     Interpretation: normal     ECG rate:  90   ECG rate assessment: normal     Ectopy: none     Conduction: normal   Comments:     Patient placed on cardiac monitor to evaluate for arrhythmias    MEDICATIONS ORDERED IN ED: Medications  ketorolac  (TORADOL ) 30 MG/ML injection 15 mg (15 mg Intravenous Given 01/02/24 2339)  famotidine (PEPCID) IVPB 20 mg premix (0 mg Intravenous Stopped 01/03/24 0008)     IMPRESSION / MDM / ASSESSMENT AND PLAN / ED COURSE  I reviewed the triage vital signs and the nursing notes.                             33 year old male presenting with  chest and back pain. Differential diagnosis includes, but is not limited to, ACS, aortic dissection, pulmonary embolism, cardiac tamponade, pneumothorax, pneumonia, pericarditis, myocarditis, GI-related causes including esophagitis/gastritis, and musculoskeletal chest wall pain.   I personally reviewed patient's records and note an orthopedic surgery office visit from 03/16/2022 for follow-up pain and fracture.  Patient was seen in the ED in March 2024 for back pain.  Patient's presentation is  most consistent with acute complicated illness / injury requiring diagnostic workup.  The patient is on the cardiac monitor to evaluate for evidence of arrhythmia and/or significant heart rate changes.  Laboratory results thus far unremarkable.  Initial troponin and chest x-ray normal.  Will repeat time troponin, check hepatic panel/lipase, CK and D-dimer.  Administer IV ketorolac , Pepcid and reassess.  Clinical Course as of 01/03/24 0204  Wed Jan 03, 2024  0159 Repeat troponin, CK, D-dimer, LFT/lipase unremarkable.  Patient feeling better.  Will discharge home with as needed NSAIDs, muscle relaxer and refer to cardiology and orthopedics for outpatient follow-up.  Strict return precautions given.  Patient verbalizes understanding and agrees with plan of care. [JS]    Clinical Course User Index [JS] Norlene Beavers, MD     FINAL CLINICAL IMPRESSION(S) / ED DIAGNOSES   Final diagnoses:  Chest pain, unspecified type  Thoracic myofascial strain, initial encounter  Strain of lumbar region, initial encounter     Rx / DC Orders   ED Discharge Orders     None        Note:  This document was prepared using Dragon voice recognition software and may include unintentional dictation errors.   Inez Rosato J, MD 01/03/24 [phone]

## 2024-01-03 LAB — HEPATIC FUNCTION PANEL
ALT: 16 U/L (ref 0–44)
AST: 18 U/L (ref 15–41)
Albumin: 4 g/dL (ref 3.5–5.0)
Alkaline Phosphatase: 53 U/L (ref 38–126)
Bilirubin, Direct: <0.1 mg/dL (ref 0.0–0.2)
Total Bilirubin: 0.8 mg/dL (ref 0.0–1.2)
Total Protein: 7.1 g/dL (ref 6.5–8.1)

## 2024-01-03 LAB — CK: Total CK: 172 U/L (ref 49–397)

## 2024-01-03 LAB — LIPASE, BLOOD: Lipase: 40 U/L (ref 11–51)

## 2024-01-03 LAB — TROPONIN I (HIGH SENSITIVITY): Troponin I (High Sensitivity): 3 ng/L (ref <18)

## 2024-01-03 LAB — D-DIMER, QUANTITATIVE: D-Dimer, Quant: <0.27 ug{FEU}/mL (ref 0.00–0.50)

## 2024-01-03 MED ORDER — CYCLOBENZAPRINE HCL 5 MG PO TABS: ORAL_TABLET | ORAL | 0 refills | Status: AC

## 2024-01-03 MED ORDER — NAPROXEN 500 MG PO TABS: 500.0000 mg | ORAL_TABLET | Freq: Two times a day (BID) | ORAL | 0 refills | Status: AC

## 2024-01-03 NOTE — Discharge Instructions (Signed)
 You may take medicines as needed for pain and muscle spasms.  Apply moist heat to affected area several times daily.  Return to the ER for worsening symptoms, persistent vomiting, difficulty breathing or other concerns.

## 2024-07-17 ENCOUNTER — Ambulatory Visit (HOSPITAL_BASED_OUTPATIENT_CLINIC_OR_DEPARTMENT_OTHER): Payer: Self-pay | Admitting: Orthopaedic Surgery

## 2024-07-17 DIAGNOSIS — M5116 Intervertebral disc disorders with radiculopathy, lumbar region: Secondary | ICD-10-CM | POA: Diagnosis not present

## 2024-07-17 NOTE — Progress Notes (Signed)
 Chief Complaint: Lower back pain     History of Present Illness:    Eddie Savage is a 33 y.o. male presents today after an ongoing injury where he was carrying a heavy rock during a training exercise in the [employer].  At that time he did feel a pop with sharp searing pain down the leg although he did work through this.  Unfortunately he has been having recurrent muscular spasms following this with persistent pain shooting down both the left and right knee.  He does enjoy staying active and does have 2 young children although this has been quite difficult with his back issues.  He has been trialing anti-inflammatories without relief    PMH/PSH/Family History/Social History/Meds/Allergies:   No past medical history on file. Past Surgical History:  Procedure Laterality Date   I & D EXTREMITY Left 12/05/2020   Procedure: IRRIGATION AND DEBRIDEMENT, HAND  PINNING METACARPAL FRACTURE;  Surgeon: Murrell Drivers, MD;  Location: [facility] OR;  Service: Orthopedics;  Laterality: Left;   OPEN REDUCTION INTERNAL FIXATION (ORIF) METACARPAL Left 12/05/2020   Procedure: OPEN REDUCTION INTERNAL FIXATION (ORIF) METACARPAL;  Surgeon: Murrell Drivers, MD;  Location: [facility] OR;  Service: Orthopedics;  Laterality: Left;   REPAIR EXTENSOR TENDON Left 12/05/2020   Procedure: REPAIR EXTENSOR TENDON AND COLATERAL LIGAMENT;  Surgeon: Murrell Drivers, MD;  Location: [facility] OR;  Service: Orthopedics;  Laterality: Left;   WISDOM TOOTH EXTRACTION     Social History   Socioeconomic History   Marital status: Single    Spouse name: Not on file   Number of children: Not on file   Years of education: Not on file   Highest education level: Not on file  Occupational History   Not on file  Tobacco Use   Smoking status: Former    Types: Cigars   Smokeless tobacco: Current    Types: Snuff  Vaping Use   Vaping status: Never Used  Substance and Sexual Activity   Alcohol use: Yes    Comment: socially   Drug use: No   Sexual activity:  Not on file  Other Topics Concern   Not on file  Social History Narrative   Not on file   Social Drivers of Health   Financial Resource Strain: Not on file  Food Insecurity: Not on file  Transportation Needs: Not on file  Physical Activity: Not on file  Stress: Not on file  Social Connections: Not on file   No family history on file. No Known Allergies Current Outpatient Medications  Medication Sig Dispense Refill   cyclobenzaprine  (FLEXERIL ) 5 MG tablet 1 tablet every 8 hours as needed for muscle spasms 15 tablet 0   naproxen  (NAPROSYN ) 500 MG tablet Take 1 tablet (500 mg total) by mouth 2 (two) times daily with a meal. 14 tablet 0   oxyCODONE -acetaminophen  (PERCOCET) 5-325 MG tablet 1-2 tabs po q6 hours prn pain 20 tablet 0   pantoprazole (PROTONIX) 20 MG tablet Take 20 mg by mouth daily. Pt called and said he has taken this meds for 2 weeks before he saw that this was not his med,  that pharmacy had given him someone else meds     sulfamethoxazole -trimethoprim  (BACTRIM  DS) 800-160 MG tablet Take 1 tablet by mouth 2 (two) times daily. 14 tablet 0   No current facility-administered medications for this visit.   No results found.  Review of Systems:   A ROS was performed including pertinent positives and negatives as documented in the HPI.  Physical Exam :   Constitutional: NAD and appears stated age Neurological: Alert and oriented Psych: Appropriate affect and cooperative There were no vitals taken for this visit.   Comprehensive Musculoskeletal Exam:    Positive straight leg raise on bilateral sides with weakness in the left quadriceps extension.  There is pain with forward flexion of the trunk.  Reflexes are equal and symmetric   Imaging:   CT scan lumbar spine: Loss of lumbar lordosis with a transitional vertebra   I personally reviewed and interpreted the radiographs.   Assessment and Plan:   33 y.o. 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  At this time given the chronicity of the injury and the fact that he has trialed anti-inflammatories including as well as muscle relaxers I will plan for an MRI of the lumbar spine and follow-up discuss results  -For MRI lumbar spine and follow-up to discuss results     I personally saw and evaluated the patient, and participated in the management and treatment plan.  Elspeth Parker, MD Attending Physician, Orthopedic Surgery  This document was dictated using Dragon voice recognition software. A reasonable attempt at proof reading has been made to minimize errors.

## 2024-07-23 ENCOUNTER — Ambulatory Visit
Admission: RE | Admit: 2024-07-23 | Discharge: 2024-07-23 | Disposition: A | Source: Ambulatory Visit | Attending: Orthopaedic Surgery

## 2024-07-23 DIAGNOSIS — M5116 Intervertebral disc disorders with radiculopathy, lumbar region: Secondary | ICD-10-CM | POA: Insufficient documentation

## 2024-07-31 ENCOUNTER — Ambulatory Visit (INDEPENDENT_AMBULATORY_CARE_PROVIDER_SITE_OTHER): Admitting: Orthopaedic Surgery

## 2024-07-31 DIAGNOSIS — M5116 Intervertebral disc disorders with radiculopathy, lumbar region: Secondary | ICD-10-CM

## 2024-07-31 NOTE — Progress Notes (Signed)
 Chief Complaint: Lower back pain     History of Present Illness:   07/31/2024: Presents today for follow-up of his lower back.  He is still experiencing significant pain  Eddie Savage is a 33 y.o. male presents today after an ongoing injury where he was carrying a heavy rock during a training exercise in the [employer].  At that time he did feel a pop with sharp searing pain down the leg although he did work through this.  Unfortunately he has been having recurrent muscular spasms following this with persistent pain shooting down both the left and right knee.  He does enjoy staying active and does have 2 young children although this has been quite difficult with his back issues.  He has been trialing anti-inflammatories without relief    PMH/PSH/Family History/Social History/Meds/Allergies:   No past medical history on file. Past Surgical History:  Procedure Laterality Date   I & D EXTREMITY Left 12/05/2020   Procedure: IRRIGATION AND DEBRIDEMENT, HAND  PINNING METACARPAL FRACTURE;  Surgeon: Murrell Drivers, MD;  Location: [facility];  Service: Orthopedics;  Laterality: Left;   OPEN REDUCTION INTERNAL FIXATION (ORIF) METACARPAL Left 12/05/2020   Procedure: OPEN REDUCTION INTERNAL FIXATION (ORIF) METACARPAL;  Surgeon: Murrell Drivers, MD;  Location: [facility] OR;  Service: Orthopedics;  Laterality: Left;   REPAIR EXTENSOR TENDON Left 12/05/2020   Procedure: REPAIR EXTENSOR TENDON AND COLATERAL LIGAMENT;  Surgeon: Murrell Drivers, MD;  Location: [facility];  Service: Orthopedics;  Laterality: Left;   WISDOM TOOTH EXTRACTION     Social History   Socioeconomic History   Marital status: Single    Spouse name: Not on file   Number of children: Not on file   Years of education: Not on file   Highest education level: Not on file  Occupational History   Not on file  Tobacco Use   Smoking status: Former    Types: Cigars   Smokeless tobacco: Current    Types: Snuff  Vaping Use   Vaping status: Never Used   Substance and Sexual Activity   Alcohol use: Yes    Comment: socially   Drug use: No   Sexual activity: Not on file  Other Topics Concern   Not on file  Social History Narrative   Not on file   Social Drivers of Health   Financial Resource Strain: Not on file  Food Insecurity: Not on file  Transportation Needs: Not on file  Physical Activity: Not on file  Stress: Not on file  Social Connections: Not on file   No family history on file. No Known Allergies Current Outpatient Medications  Medication Sig Dispense Refill   cyclobenzaprine  (FLEXERIL ) 5 MG tablet 1 tablet every 8 hours as needed for muscle spasms 15 tablet 0   naproxen  (NAPROSYN ) 500 MG tablet Take 1 tablet (500 mg total) by mouth 2 (two) times daily with a meal. 14 tablet 0   oxyCODONE -acetaminophen  (PERCOCET) 5-325 MG tablet 1-2 tabs po q6 hours prn pain 20 tablet 0   pantoprazole (PROTONIX) 20 MG tablet Take 20 mg by mouth daily. Pt called and said he has taken this meds for 2 weeks before he saw that this was not his med,  that pharmacy had given him someone else meds     sulfamethoxazole -trimethoprim  (BACTRIM  DS) 800-160 MG tablet Take 1 tablet by mouth 2 (two) times daily. 14 tablet 0   No current facility-administered medications for this visit.   No results found.  Review of Systems:  A ROS was performed including pertinent positives and negatives as documented in the HPI.  Physical Exam :   Constitutional: NAD and appears stated age Neurological: Alert and oriented Psych: Appropriate affect and cooperative There were no vitals taken for this visit.   Comprehensive Musculoskeletal Exam:    Positive straight leg raise on bilateral sides with weakness in the left quadriceps extension.  There is pain with forward flexion of the trunk.  Reflexes are equal and symmetric   Imaging:   CT scan lumbar spine: Loss of lumbar lordosis with a transitional vertebra  MRI lumbar spine: There is a degenerative  disc at L5-S1 with loss of height  I personally reviewed and interpreted the radiographs.   Assessment and Plan:   33 y.o. 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  He is continuing to have persistent pain and at this time the [place] has requested a description of his limitations and given that we will plan to send him for a functional capacity evaluation.  I would like to send him for a second opinion with neurosurgical spine to see if he would be a candidate for any type of minimally invasive endoscopic approach which they are doing.  I we will also plan for referral to pain management as well as he is quite young and having somewhat disabling symptoms  - Return to clinic as needed     I personally saw and evaluated the patient, and participated in the management and treatment plan.  Elspeth Parker, MD Attending Physician, Orthopedic Surgery  This document was dictated using Dragon voice recognition software. A reasonable attempt at proof reading has been made to minimize errors.

## 2024-08-01 ENCOUNTER — Telehealth (HOSPITAL_BASED_OUTPATIENT_CLINIC_OR_DEPARTMENT_OTHER): Payer: Self-pay | Admitting: Orthopaedic Surgery

## 2024-08-01 NOTE — Telephone Encounter (Signed)
 Patient states that Dr B is supposed to to send him for functional compactly eval and a spine eval. Patient would like to know who the Drs are please respond via mychart

## 2024-08-02 ENCOUNTER — Encounter (HOSPITAL_BASED_OUTPATIENT_CLINIC_OR_DEPARTMENT_OTHER): Payer: Self-pay | Admitting: Orthopaedic Surgery

## 2024-08-02 ENCOUNTER — Telehealth (HOSPITAL_BASED_OUTPATIENT_CLINIC_OR_DEPARTMENT_OTHER): Payer: Self-pay | Admitting: Orthopaedic Surgery

## 2024-08-02 NOTE — Telephone Encounter (Signed)
 Patient needs he light duty note to say for 180 and limitations

## 2024-09-03 ENCOUNTER — Emergency Department

## 2024-09-03 ENCOUNTER — Encounter: Payer: Self-pay | Admitting: Emergency Medicine

## 2024-09-03 ENCOUNTER — Other Ambulatory Visit: Payer: Self-pay

## 2024-09-03 DIAGNOSIS — M5440 Lumbago with sciatica, unspecified side: Secondary | ICD-10-CM | POA: Insufficient documentation

## 2024-09-03 DIAGNOSIS — W108XXA Fall (on) (from) other stairs and steps, initial encounter: Secondary | ICD-10-CM | POA: Diagnosis not present

## 2024-09-03 DIAGNOSIS — S0990XA Unspecified injury of head, initial encounter: Secondary | ICD-10-CM | POA: Diagnosis present

## 2024-09-03 DIAGNOSIS — S3992XA Unspecified injury of lower back, initial encounter: Secondary | ICD-10-CM | POA: Diagnosis not present

## 2024-09-03 NOTE — ED Triage Notes (Signed)
 Pt presents to the ED via [facility] with complaints of back pain exacerbated by fall down 10 steps tonight. Hx of chronic low back pain. Rates the pain 10/10.A&Ox4 at this time. Denies no LOC, CP or SOB.    VSS 142/90 90 100% CBG-103

## 2024-09-04 ENCOUNTER — Emergency Department
Admission: EM | Admit: 2024-09-04 | Discharge: 2024-09-04 | Disposition: A | Attending: Emergency Medicine | Admitting: Emergency Medicine

## 2024-09-04 DIAGNOSIS — W19XXXA Unspecified fall, initial encounter: Secondary | ICD-10-CM

## 2024-09-04 DIAGNOSIS — S0990XA Unspecified injury of head, initial encounter: Secondary | ICD-10-CM

## 2024-09-04 DIAGNOSIS — M5440 Lumbago with sciatica, unspecified side: Secondary | ICD-10-CM

## 2024-09-04 DIAGNOSIS — S3992XA Unspecified injury of lower back, initial encounter: Secondary | ICD-10-CM

## 2024-09-04 MED ORDER — DEXAMETHASONE SOD PHOSPHATE PF 10 MG/ML IJ SOLN
10.0000 mg | Freq: Once | INTRAMUSCULAR | Status: AC
  Administered 2024-09-04: 10 mg via INTRAMUSCULAR
  Filled 2024-09-04: qty 1

## 2024-09-04 MED ORDER — KETOROLAC TROMETHAMINE 30 MG/ML IJ SOLN
30.0000 mg | Freq: Once | INTRAMUSCULAR | Status: AC
  Administered 2024-09-04: 30 mg via INTRAMUSCULAR
  Filled 2024-09-04: qty 1

## 2024-09-04 MED ORDER — LIDOCAINE 5 % EX PTCH
1.0000 | MEDICATED_PATCH | CUTANEOUS | Status: DC
  Administered 2024-09-04: 1 via TRANSDERMAL
  Filled 2024-09-04: qty 1

## 2024-09-04 NOTE — Discharge Instructions (Signed)
 Take acetaminophen  650 mg and ibuprofen  400 mg every 6 hours for pain.  Take with food. Use pain patches daily.   Follow up with your back pain specialist team as scheduled.   Thank you for choosing us  for your health care today!  Please see your primary doctor this week for a follow up appointment.   If you have any new, worsening, or unexpected symptoms call your doctor right away or come back to the emergency department for reevaluation.  It was my pleasure to care for you today.   Ginnie EDISON Cyrena, MD

## 2024-09-04 NOTE — ED Provider Notes (Signed)
 "  [facility] Provider Note    Event Date/Time   First MD Initiated Contact with Patient 09/04/24 [phone]     (approximate)   History   Fall and Back Pain   HPI  Eddie Savage is a 34 y.o. male   Past medical history of back pain due to bulging disc and L sided sciatica longstanding here w/ fall. When raising L leg to walk up stairs had lightning like shock down L leg (similar to prior) and fell backward down stairs. Struck head. Lower back pain worsened.   No incontinence, sensory loss, or focal weakness.   No other acute medical complaints.   Well connected with spinal specialist (ortho) with referral to pain clinic. Wants to avoid long term medications and especially narcotics.      External Medical Documents Reviewed: December 2025 MRI lumbar spine w disc bulging in L5-S1       Physical Exam   Triage Vital Signs: ED Triage Vitals  Encounter Vitals Group     BP 09/03/24 2329 129/78     Girls Systolic BP Percentile --      Girls Diastolic BP Percentile --      Boys Systolic BP Percentile --      Boys Diastolic BP Percentile --      Pulse Rate 09/03/24 2329 92     Resp 09/03/24 2329 18     Temp 09/03/24 2329 98 F (36.7 C)     Temp src --      SpO2 09/03/24 2329 100 %     Weight 09/03/24 2330 130 lb 8.2 oz (59.2 kg)     Height 09/03/24 2330 5' 10 (1.778 m)     Head Circumference --      Peak Flow --      Pain Score --      Pain Loc --      Pain Education --      Exclude from Growth Chart --     Most recent vital signs: Vitals:   09/03/24 2329  BP: 129/78  Pulse: 92  Resp: 18  Temp: 98 F (36.7 C)  SpO2: 100%    General: Awake, no distress.  CV:  Good peripheral perfusion.  Resp:  Normal effort.  Abd:  No distention.  Other:  Motor and sensory intact to bilateral lower extremities. No midline bony tenderness of C/T/L spine and no external head trauma noted. Dried blood in L nares no septal hematoma. Chest, abd and upper  limbs atraumatic.    ED Results / Procedures / Treatments   Labs (all labs ordered are listed, but only abnormal results are displayed) Labs Reviewed - No data to display   RADIOLOGY I independently reviewed and interpreted L spine xr and see no fracture I also reviewed radiologist's formal read.   PROCEDURES:  Critical Care performed: No  Procedures   MEDICATIONS ORDERED IN ED: Medications  dexamethasone  (DECADRON ) injection 10 mg (has no administration in time range)  ketorolac  (TORADOL ) 30 MG/ML injection 30 mg (has no administration in time range)  lidocaine  (LIDODERM ) 5 % 1 patch (has no administration in time range)    IMPRESSION / MDM / ASSESSMENT AND PLAN / ED COURSE  I reviewed the triage vital signs and the nursing notes.                                Patient's presentation is most consistent  with acute presentation with potential threat to life or bodily function.  Differential diagnosis includes, but is not limited to, head injury, skull fx, ich, c spine injury, l spine injury, cord compression   MDM:    Fall down stairs from sciatic pain longstanding, w head injury and lower back injury. No evidence of life threatening acute head/neck injury on exam and imaging. No fracture on L spine xr, and no evidence of cord compression. Pain meds, and dc .        FINAL CLINICAL IMPRESSION(S) / ED DIAGNOSES   Final diagnoses:  Fall, initial encounter  Back injury, initial encounter  Back pain of lumbar region with sciatica  Minor closed head injury     Rx / DC Orders   ED Discharge Orders     None        Note:  This document was prepared using Dragon voice recognition software and may include unintentional dictation errors.    Cyrena Mylar, MD 09/04/24 [phone]  "

## 2024-09-05 NOTE — Progress Notes (Signed)
 "  Referring Physician:  Genelle Standing, MD [address] [place],  [place] [address]  Primary Physician:  Patient, No Pcp Per  History of Present Illness: 09/09/2024 Mr. Eddie Savage is healthy.   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   [facility] of [facility] (10/07/24). He was also sent to pain management (not scheduled yet). He also [facility] on [facility]18/24.   Seen in ED on 09/03/24 after falling down stairs due to his legs giving way. Had CT of head and cervical spine.   He has constant LBP with intermittent bilateral anterior leg pain to his feet. He has numbness, tingling in both legs along with weakness. Legs give way intermittently with stairs and walking, happens in both left and right leg. He has some radiation into his upper back when he lays flat. He has no comfortable position. Has trouble sleeping at night due to pain.   He also has intermittent pain in his neck and shoulders that is worse with lifting his arms/moving his shoulders. This causes shooting pain down his back. He has intermittent numbness and tingling in his hands. No dexterity issues.   History of surgery on left index finger- has numbness and pain in that finger since surgery.   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   He is not taking any current medications for his back. They helped but he hates to take medications.   Tobacco use: Does not smoke.   Bowel/Bladder Dysfunction: none  Conservative measures:  Physical therapy:  Sent to [facility]) for FCE-scheduled for 10/07/24; no PT for his back Multimodal medical therapy including regular antiinflammatories:  Baclofen , Flexeril , Naproxen , Percocet Injections: no epidural steroid injections  Past Surgery: no spine  surgeries  Eddie Savage has no symptoms of cervical myelopathy.  The symptoms are causing a significant impact on the patient's life.   Review of Systems:  A 10 point review of systems is negative, except for the pertinent positives and negatives detailed in the HPI.  Past Medical History: No past medical history on file.  Past Surgical History: Past Surgical History:  Procedure Laterality Date   I & D EXTREMITY Left 12/05/2020   Procedure: IRRIGATION AND DEBRIDEMENT, HAND  PINNING METACARPAL FRACTURE;  Surgeon: Murrell Drivers, MD;  Location: [facility] OR;  Service: Orthopedics;  Laterality: Left;   OPEN REDUCTION INTERNAL FIXATION (ORIF) METACARPAL Left 12/05/2020   Procedure: OPEN REDUCTION INTERNAL FIXATION (ORIF) METACARPAL;  Surgeon: Murrell Drivers, MD;  Location: [facility] OR;  Service: Orthopedics;  Laterality: Left;   REPAIR EXTENSOR TENDON Left 12/05/2020   Procedure: REPAIR EXTENSOR TENDON AND COLATERAL LIGAMENT;  Surgeon: Murrell Drivers, MD;  Location: [facility];  Service: Orthopedics;  Laterality: Left;   WISDOM TOOTH EXTRACTION      Allergies: Allergies as of 09/09/2024   (No Known Allergies)    Medications: Outpatient Encounter Medications as of 09/09/2024  Medication Sig   cyclobenzaprine  (FLEXERIL ) 5 MG tablet 1 tablet every 8 hours as needed for muscle spasms (Patient not taking: Reported on 09/09/2024)   naproxen  (NAPROSYN ) 500 MG tablet Take 1 tablet (500 mg total) by mouth 2 (two) times daily with a meal. (Patient not taking: Reported on 09/09/2024)   oxyCODONE -acetaminophen  (PERCOCET) 5-325 MG tablet 1-2 tabs po q6 hours  prn pain (Patient not taking: Reported on 09/09/2024)   pantoprazole (PROTONIX) 20 MG tablet Take 20 mg by mouth daily. Pt called and said he has taken this meds for 2 weeks before he saw that this was not his med,  that pharmacy had given him someone else meds (Patient not taking: Reported on 09/09/2024)   [DISCONTINUED] sulfamethoxazole -trimethoprim  (BACTRIM  DS) 800-160  MG tablet Take 1 tablet by mouth 2 (two) times daily.   No facility-administered encounter medications on file as of 09/09/2024.    Social History: Social History[1]  Family Medical History: No family history on file.  Physical Examination: Vitals:   09/09/24 1105  BP: 123/80    General: Patient is well developed, well nourished, calm, collected, and in no apparent distress. Attention to examination is appropriate.  Respiratory: Patient is breathing without any difficulty.   NEUROLOGICAL:     Awake, alert, oriented to person, place, and time.  Speech is clear and fluent. Fund of knowledge is appropriate.   Cranial Nerves: Pupils equal round and reactive to light.  Facial tone is symmetric.    No posterior cervical tenderness. No tenderness in bilateral trapezial region.   Mild lower thoracic and diffuse lower lumbar tenderness.   No abnormal lesions on exposed skin.   Strength: Side Biceps Triceps Deltoid Interossei Grip Wrist Ext. Wrist Flex.  R 5 5 5 5 5 5 5   L 5 5 5 5 5 5 5    Side Iliopsoas Quads Hamstring PF DF EHL  R 5 5 5 5 5 5   L 5 5 5 5 5 5    No gross weakness noted in strength testing, but he has a lot of pain and shaking of arms/legs during testing.   Reflexes are 3+ and symmetric at the biceps, brachioradialis, patella and achilles.   Hoffman's is absent.  Clonus is not present.   Bilateral upper and lower extremity sensation is intact to light touch.     He can heel stand on right and left foot. He has difficulty toe standing independently on both right and left foot.   Gait is slow and slightly unsteady.   Medical Decision Making  Imaging: Lumbar xrays dated 09/03/24:  FINDINGS:   LUMBAR SPINE: BONES: Transitional lumbar anatomy with 6 non-weightbearing segments of the lumbar spine. Overall straightening of the lumbar spine. No acute fracture or listhesis. Vertebral body heights are preserved.   DISCS AND DEGENERATIVE CHANGES: Intervertebral  disc heights are preserved. No severe degenerative changes.   SOFT TISSUES: No acute abnormality.   IMPRESSION: 1. No acute fracture or listhesis. 2. Transitional lumbar anatomy with 6 non-rib bearing segments of the lumbar spine.   Electronically signed by: Dorethia Molt MD 09/04/2024 12:06 AM EST RP Workstation: [id number]    Lumbar MRI dated 07/23/24:  FINDINGS:   BONES AND ALIGNMENT: Normal alignment. Normal vertebral body heights. Bone marrow signal is unremarkable.   SPINAL CORD: The conus terminates normally.   SOFT TISSUES: No paraspinal mass.   L1-L2: No significant disc herniation. No spinal canal stenosis or neural foraminal narrowing.   L2-L3: No significant disc herniation. No spinal canal stenosis or neural foraminal narrowing.   L3-L4: No significant disc herniation. No spinal canal stenosis or neural foraminal narrowing.   L4-L5: No significant disc herniation. No spinal canal stenosis or neural foraminal narrowing.   L5-S1: Disc height loss and desiccation. Small right eccentric disc bulge with annular fissure. Prominent epidural fat. No significant spinal canal stenosis or neural foraminal narrowing. 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   IMPRESSION: 1. Mild disc bulging at L5-S1 without significant canal or foraminal stenosis.   Electronically signed by: Gilmore Molt MD 07/27/2024 12:39 PM EST RP Workstation: [id number]   Cervical CT scan dated 09/03/24:  FINDINGS:   BONES AND ALIGNMENT: No acute fracture or traumatic malalignment.   DEGENERATIVE CHANGES: No significant degenerative changes.   SOFT TISSUES: No prevertebral soft tissue swelling.   LUNGS: Biapical pleural/pulmonary scarring.   IMPRESSION: 1. No evidence of acute traumatic injury.   Electronically signed by: Morgane Naveau MD 09/04/2024 12:14 AM EST RP Workstation: [id number]    I have personally reviewed the images and agree with the above  interpretation.  Assessment and Plan: Mr. Luckey had injury in February of 2025 in [employer]- he was  carrying heavy rucksack and felt sharp pain down the leg.   Since above injury, he has constant LBP with intermittent bilateral anterior leg pain to his feet. He has numbness, tingling in both legs along with weakness. Legs give way intermittently with stairs and walking, happens in both left and right leg.   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 No central or foraminal stenosis seen on his lumbar MRI.   LBP may be from DDD, but I don't see good explanation of leg pain.     He also has intermittent pain in his neck and shoulders that is worse with lifting his arms/moving his shoulders. This causes shooting pain down his back. He has intermittent numbness and tingling in his hands. No dexterity issues.   History of surgery on left index finger- has numbness and pain in that finger since surgery.   CT of cervical spine reviewed and looks okay.    No gross weakness on strength testing, but he has increased pain and arms/legs shake. He does have difficulty with standing on toes independently on left and right foot. He is hyper reflexic with some gait unsteadiness as well.   Treatment options discussed with patient and following plan made:   - Cervical xrays with flex/ext and lumbar xrays with flex/ext done on his way out of clinic.  - MRI of cervical spine to evaluate hyper reflexia.  - Recommend EMG/NCS of bilateral lower extremities to further evaluate leg pain/weakness. Order to neurology at [facility].  - Letter to continue with light duty in [employer].  - Follow up scheduled with me on 10/15/24 to review above imaging. Will review EMG once completed.  - At follow up, can discuss further treatment options such as PT and/or injections as well.   I spent a total of 45 minutes in face-to-face and non-face-to-face activities related to this patient's care today including review of  outside records, review of imaging, review of symptoms, physical exam, discussion of differential diagnosis, discussion of treatment options, and documentation.   Thank you for involving me in the care of this patient.   Glade Boys PA-C Dept. of Neurosurgery      [1]  Social History Tobacco Use   Smoking status: Former    Types: Cigars   Smokeless tobacco: Former    Types: Snuff  Vaping Use   Vaping status: Never Used  Substance Use Topics   Alcohol use: Yes    Comment: socially   Drug use: No   "

## 2024-09-09 ENCOUNTER — Encounter: Payer: Self-pay | Admitting: Orthopedic Surgery

## 2024-09-09 ENCOUNTER — Telehealth: Payer: Self-pay | Admitting: Orthopedic Surgery

## 2024-09-09 ENCOUNTER — Ambulatory Visit: Admitting: Orthopedic Surgery

## 2024-09-09 ENCOUNTER — Ambulatory Visit

## 2024-09-09 VITALS — BP 123/80 | Ht 70.0 in | Wt 130.4 lb

## 2024-09-09 DIAGNOSIS — R292 Abnormal reflex: Secondary | ICD-10-CM | POA: Diagnosis not present

## 2024-09-09 DIAGNOSIS — M4726 Other spondylosis with radiculopathy, lumbar region: Secondary | ICD-10-CM | POA: Diagnosis not present

## 2024-09-09 DIAGNOSIS — M47816 Spondylosis without myelopathy or radiculopathy, lumbar region: Secondary | ICD-10-CM

## 2024-09-09 DIAGNOSIS — M5416 Radiculopathy, lumbar region: Secondary | ICD-10-CM

## 2024-09-09 DIAGNOSIS — M549 Dorsalgia, unspecified: Secondary | ICD-10-CM | POA: Diagnosis not present

## 2024-09-09 DIAGNOSIS — M51362 Other intervertebral disc degeneration, lumbar region with discogenic back pain and lower extremity pain: Secondary | ICD-10-CM

## 2024-09-09 DIAGNOSIS — M542 Cervicalgia: Secondary | ICD-10-CM | POA: Diagnosis not present

## 2024-09-09 NOTE — Telephone Encounter (Signed)
 Ordered EMG of bilateral Les with [facility]- please make sure Maree does it.

## 2024-09-09 NOTE — Patient Instructions (Signed)
 It was so nice to see you today. Thank you so much for coming in.    You have some wear and tear in your lower back and this may explain the lower back pain. I don't see anything that would explain your leg pain.   You had xrays of your neck and lower back today. Will review results at your follow up.   I want to get an MRI of your neck to look into things further. We will get this approved through your insurance and [facility] will call you to schedule the appointment. Ask about your patient responsibility. You do not need to pay this prior to getting MRI, they can bill you.   [facility] is located at [address] in [place]. This is near the intersection of [address] and [address].   I want you to see neurology at the [facility] for a nerve test or EMG. They should call you to schedule an appointment or you can call them at [phone].   You have a follow up to see me on 10/15/24- we can move up if needed to review your results.   Please do not hesitate to call if you have any questions or concerns. You can also message me in MyChart.   Glade Boys PA-C [phone]     The physicians and staff at [facility] at [facility] are committed to providing excellent care. You may receive a survey asking for feedback about your experience at our office. We value you your feedback and appreciate you taking the time to to fill it out. The [facility] leadership team is also available to discuss your experience in person, feel free to contact us  [phone].

## 2024-09-09 NOTE — Telephone Encounter (Signed)
 EMG faxed to [facility].

## 2024-09-09 NOTE — Telephone Encounter (Signed)
 Order placed on Stacy's desk to sign.

## 2024-09-16 ENCOUNTER — Inpatient Hospital Stay: Admission: RE | Admit: 2024-09-16 | Source: Ambulatory Visit

## 2024-09-27 ENCOUNTER — Other Ambulatory Visit

## 2024-10-04 ENCOUNTER — Other Ambulatory Visit

## 2024-10-15 ENCOUNTER — Ambulatory Visit: Admitting: Orthopedic Surgery
